# Patient Record
Sex: Female | Born: 1965 | Race: Asian | Hispanic: No | Marital: Married | Smoking: Never smoker
Health system: Southern US, Community
[De-identification: ages and names within clinical notes are randomized; demographics above are authoritative.]

## PROBLEM LIST (undated history)

## (undated) DIAGNOSIS — Z789 Other specified health status: Secondary | ICD-10-CM

## (undated) DIAGNOSIS — K219 Gastro-esophageal reflux disease without esophagitis: Secondary | ICD-10-CM

## (undated) DIAGNOSIS — I10 Essential (primary) hypertension: Secondary | ICD-10-CM

## (undated) HISTORY — PX: APPENDECTOMY: SHX54

## (undated) HISTORY — PX: NO PAST SURGERIES: SHX2092

---

## 2006-07-07 ENCOUNTER — Emergency Department (HOSPITAL_COMMUNITY): Admission: EM | Admit: 2006-07-07 | Discharge: 2006-07-07 | Payer: Self-pay | Admitting: Emergency Medicine

## 2006-08-11 ENCOUNTER — Ambulatory Visit (HOSPITAL_COMMUNITY): Admission: RE | Admit: 2006-08-11 | Discharge: 2006-08-11 | Payer: Self-pay | Admitting: Family Medicine

## 2006-10-10 ENCOUNTER — Encounter: Admission: RE | Admit: 2006-10-10 | Discharge: 2006-10-10 | Payer: Self-pay | Admitting: Obstetrics & Gynecology

## 2006-10-10 ENCOUNTER — Ambulatory Visit: Payer: Self-pay | Admitting: Obstetrics & Gynecology

## 2006-10-17 ENCOUNTER — Ambulatory Visit: Payer: Self-pay | Admitting: Obstetrics & Gynecology

## 2006-11-14 ENCOUNTER — Ambulatory Visit: Payer: Self-pay | Admitting: Obstetrics & Gynecology

## 2006-11-14 ENCOUNTER — Encounter: Admission: RE | Admit: 2006-11-14 | Discharge: 2006-11-14 | Payer: Self-pay | Admitting: Obstetrics & Gynecology

## 2006-11-21 ENCOUNTER — Ambulatory Visit (HOSPITAL_COMMUNITY): Admission: RE | Admit: 2006-11-21 | Discharge: 2006-11-21 | Payer: Self-pay | Admitting: Family Medicine

## 2006-11-21 ENCOUNTER — Ambulatory Visit: Payer: Self-pay | Admitting: Obstetrics & Gynecology

## 2006-11-24 ENCOUNTER — Ambulatory Visit: Payer: Self-pay | Admitting: Family Medicine

## 2006-11-28 ENCOUNTER — Ambulatory Visit: Payer: Self-pay | Admitting: Family Medicine

## 2006-12-01 ENCOUNTER — Ambulatory Visit: Payer: Self-pay | Admitting: Family Medicine

## 2006-12-05 ENCOUNTER — Ambulatory Visit (HOSPITAL_COMMUNITY): Admission: RE | Admit: 2006-12-05 | Discharge: 2006-12-05 | Payer: Self-pay | Admitting: Obstetrics & Gynecology

## 2006-12-05 ENCOUNTER — Ambulatory Visit: Payer: Self-pay | Admitting: Obstetrics & Gynecology

## 2006-12-08 ENCOUNTER — Ambulatory Visit: Payer: Self-pay | Admitting: Family Medicine

## 2006-12-12 ENCOUNTER — Ambulatory Visit (HOSPITAL_COMMUNITY): Admission: RE | Admit: 2006-12-12 | Discharge: 2006-12-12 | Payer: Self-pay | Admitting: Obstetrics & Gynecology

## 2006-12-12 ENCOUNTER — Ambulatory Visit: Payer: Self-pay | Admitting: Obstetrics & Gynecology

## 2006-12-15 ENCOUNTER — Ambulatory Visit: Payer: Self-pay | Admitting: Family Medicine

## 2006-12-19 ENCOUNTER — Ambulatory Visit (HOSPITAL_COMMUNITY): Admission: RE | Admit: 2006-12-19 | Discharge: 2006-12-19 | Payer: Self-pay | Admitting: Obstetrics & Gynecology

## 2006-12-19 ENCOUNTER — Ambulatory Visit: Payer: Self-pay | Admitting: Obstetrics & Gynecology

## 2006-12-21 ENCOUNTER — Inpatient Hospital Stay (HOSPITAL_COMMUNITY): Admission: RE | Admit: 2006-12-21 | Discharge: 2006-12-22 | Payer: Self-pay | Admitting: Family Medicine

## 2006-12-21 ENCOUNTER — Encounter: Payer: Self-pay | Admitting: Family Medicine

## 2006-12-21 ENCOUNTER — Ambulatory Visit: Payer: Self-pay | Admitting: Obstetrics & Gynecology

## 2007-07-27 IMAGING — US US OB FOLLOW-UP
1 series · 3 of 3 positions shown · non-contrast
Comparison: none

OBSTETRICAL ULTRASOUND:

 This ultrasound exam was performed in the [HOSPITAL] Ultrasound Department.  The OB US report was generated in the AS system, and faxed to the ordering physician.  This report is also available in [REDACTED] PACS.

[Series 1: us ob follow-up · 3 of 3 slices shown]
[im 1/3]
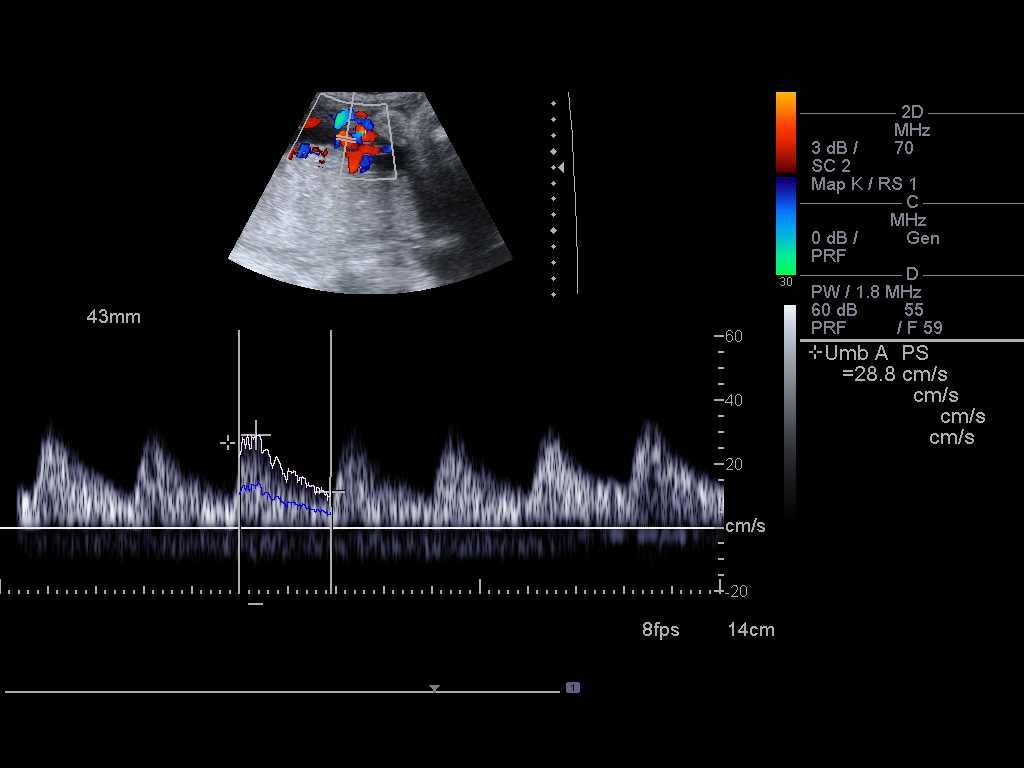
[im 2/3]
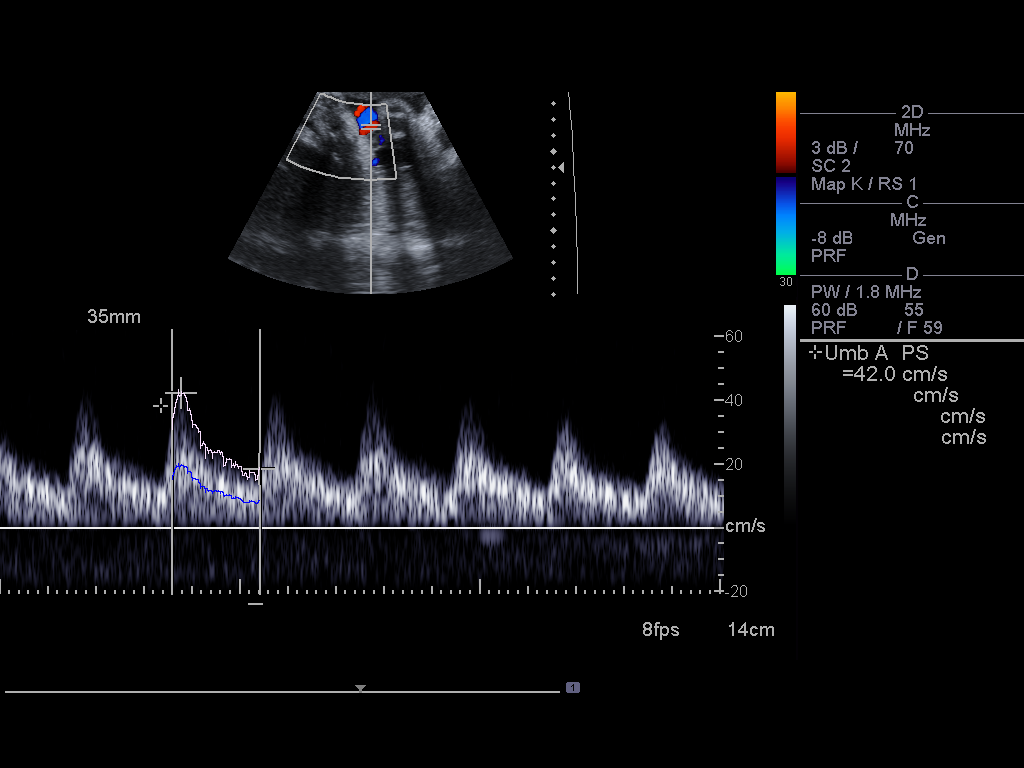
[im 3/3]
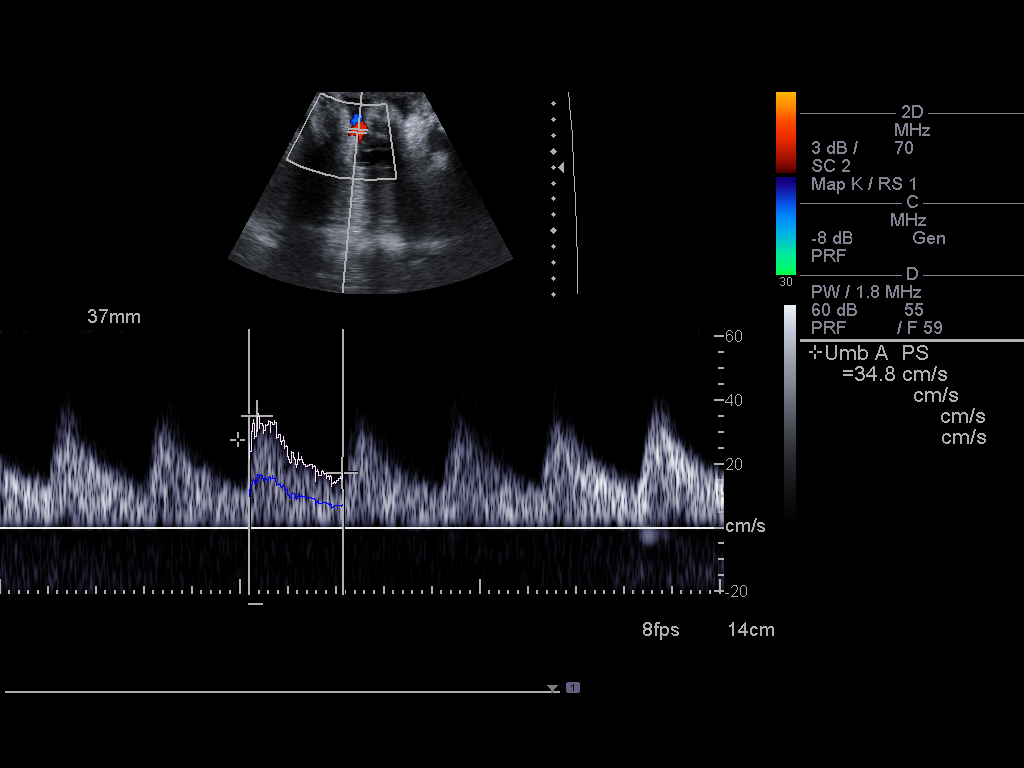

[3 of 3 positions shown; findings below may reference images not displayed]

IMPRESSION: See AS Obstetric US report.

## 2010-05-24 ENCOUNTER — Encounter: Payer: Self-pay | Admitting: *Deleted

## 2011-02-12 LAB — CBC
HCT: 29.8 — ABNORMAL LOW
HCT: 33.7 — ABNORMAL LOW
Hemoglobin: 10.9 — ABNORMAL LOW
Hemoglobin: 9.7 — ABNORMAL LOW
MCHC: 32.4
MCV: 67 — ABNORMAL LOW
RBC: 4.44
RBC: 5.02
RDW: 15.6 — ABNORMAL HIGH
WBC: 21.7 — ABNORMAL HIGH

## 2011-02-12 LAB — POCT URINALYSIS DIP (DEVICE)
Bilirubin Urine: NEGATIVE
Glucose, UA: NEGATIVE
Ketones, ur: NEGATIVE
Operator id: 297281
Protein, ur: 30 — AB
Specific Gravity, Urine: 1.02

## 2011-02-15 LAB — POCT URINALYSIS DIP (DEVICE)
Bilirubin Urine: NEGATIVE
Bilirubin Urine: NEGATIVE
Bilirubin Urine: NEGATIVE
Bilirubin Urine: NEGATIVE
Glucose, UA: NEGATIVE
Glucose, UA: NEGATIVE
Glucose, UA: NEGATIVE
Glucose, UA: NEGATIVE
Glucose, UA: NEGATIVE
Hgb urine dipstick: NEGATIVE
Hgb urine dipstick: NEGATIVE
Hgb urine dipstick: NEGATIVE
Hgb urine dipstick: NEGATIVE
Hgb urine dipstick: NEGATIVE
Ketones, ur: NEGATIVE
Nitrite: NEGATIVE
Nitrite: POSITIVE — AB
Specific Gravity, Urine: 1.02
Specific Gravity, Urine: 1.025
Specific Gravity, Urine: <=1.005
Specific Gravity, Urine: 1.015
Specific Gravity, Urine: 1.02
Urobilinogen, UA: 1
Urobilinogen, UA: 1
pH: 6.5
pH: 6.5
pH: 7
pH: 7
pH: 7

## 2011-02-16 LAB — POCT URINALYSIS DIP (DEVICE)
Bilirubin Urine: NEGATIVE
Glucose, UA: NEGATIVE
Hgb urine dipstick: NEGATIVE
Ketones, ur: NEGATIVE
Nitrite: NEGATIVE
Specific Gravity, Urine: 1.02
pH: 7

## 2011-02-17 LAB — POCT URINALYSIS DIP (DEVICE)
Bilirubin Urine: NEGATIVE
Glucose, UA: NEGATIVE
Hgb urine dipstick: NEGATIVE
Ketones, ur: NEGATIVE
Nitrite: NEGATIVE

## 2011-02-18 LAB — POCT URINALYSIS DIP (DEVICE)
Glucose, UA: NEGATIVE
Hgb urine dipstick: NEGATIVE
Ketones, ur: NEGATIVE
Nitrite: NEGATIVE

## 2013-05-09 ENCOUNTER — Observation Stay (HOSPITAL_COMMUNITY)
Admission: EM | Admit: 2013-05-09 | Discharge: 2013-05-10 | Disposition: A | Discharge status: Home / Self Care | Payer: Medicaid Other | Attending: General Surgery | Admitting: General Surgery

## 2013-05-09 ENCOUNTER — Encounter (HOSPITAL_COMMUNITY): Payer: Self-pay | Admitting: Emergency Medicine

## 2013-05-09 ENCOUNTER — Encounter (HOSPITAL_COMMUNITY)
Admission: EM | Disposition: A | Discharge status: Home / Self Care | Payer: Self-pay | Source: Home / Self Care | Attending: Emergency Medicine

## 2013-05-09 ENCOUNTER — Ambulatory Visit (HOSPITAL_COMMUNITY): Payer: Medicaid Other

## 2013-05-09 ENCOUNTER — Encounter (HOSPITAL_COMMUNITY): Payer: Medicaid Other | Admitting: Anesthesiology

## 2013-05-09 ENCOUNTER — Observation Stay (HOSPITAL_COMMUNITY): Payer: Medicaid Other | Admitting: Anesthesiology

## 2013-05-09 DIAGNOSIS — J90 Pleural effusion, not elsewhere classified: Secondary | ICD-10-CM | POA: Insufficient documentation

## 2013-05-09 DIAGNOSIS — K3580 Unspecified acute appendicitis: Secondary | ICD-10-CM

## 2013-05-09 DIAGNOSIS — K37 Unspecified appendicitis: Secondary | ICD-10-CM | POA: Diagnosis present

## 2013-05-09 DIAGNOSIS — K429 Umbilical hernia without obstruction or gangrene: Secondary | ICD-10-CM | POA: Insufficient documentation

## 2013-05-09 HISTORY — PX: LAPAROSCOPIC APPENDECTOMY: SHX408

## 2013-05-09 HISTORY — DX: Other specified health status: Z78.9

## 2013-05-09 LAB — COMPREHENSIVE METABOLIC PANEL
ALT: 11 U/L (ref 0–35)
AST: 16 U/L (ref 0–37)
Albumin: 4.5 g/dL (ref 3.5–5.2)
Alkaline Phosphatase: 63 U/L (ref 39–117)
BUN: 16 mg/dL (ref 6–23)
CO2: 25 mEq/L (ref 19–32)
Calcium: 9.5 mg/dL (ref 8.4–10.5)
Chloride: 98 mEq/L (ref 96–112)
Creatinine, Ser: 0.63 mg/dL (ref 0.50–1.10)
GFR calc Af Amer: >90 mL/min (ref >90)
GFR calc non Af Amer: >90 mL/min (ref >90)
Glucose, Bld: 97 mg/dL (ref 70–99)
Potassium: 3.8 mEq/L (ref 3.7–5.3)
Sodium: 138 mEq/L (ref 137–147)
Total Bilirubin: 0.4 mg/dL (ref 0.3–1.2)
Total Protein: 7.8 g/dL (ref 6.0–8.3)

## 2013-05-09 LAB — CBC WITH DIFFERENTIAL/PLATELET
Basophils Absolute: 0 10*3/uL (ref 0.0–0.1)
Basophils Relative: 0 % (ref 0–1)
Eosinophils Absolute: 0.1 10*3/uL (ref 0.0–0.7)
Eosinophils Relative: 1 % (ref 0–5)
HCT: 39 % (ref 36.0–46.0)
Hemoglobin: 12.5 g/dL (ref 12.0–15.0)
Lymphocytes Relative: 15 % (ref 12–46)
Lymphs Abs: 2.3 10*3/uL (ref 0.7–4.0)
MCH: 20 pg — ABNORMAL LOW (ref 26.0–34.0)
MCHC: 32.1 g/dL (ref 30.0–36.0)
MCV: 62.3 fL — ABNORMAL LOW (ref 78.0–100.0)
Monocytes Absolute: 0.7 10*3/uL (ref 0.1–1.0)
Monocytes Relative: 4 % (ref 3–12)
Neutro Abs: 12.1 10*3/uL — ABNORMAL HIGH (ref 1.7–7.7)
Neutrophils Relative %: 80 % — ABNORMAL HIGH (ref 43–77)
Platelets: 204 10*3/uL (ref 150–400)
RBC: 6.26 MIL/uL — ABNORMAL HIGH (ref 3.87–5.11)
RDW: 14.4 % (ref 11.5–15.5)
WBC: 15.1 10*3/uL — ABNORMAL HIGH (ref 4.0–10.5)

## 2013-05-09 LAB — URINALYSIS, ROUTINE W REFLEX MICROSCOPIC
Bilirubin Urine: NEGATIVE
Glucose, UA: NEGATIVE mg/dL
Ketones, ur: 15 mg/dL — AB
Leukocytes, UA: NEGATIVE
Nitrite: NEGATIVE
Protein, ur: NEGATIVE mg/dL
Specific Gravity, Urine: 1.025 (ref 1.005–1.030)
Urobilinogen, UA: 0.2 mg/dL (ref 0.0–1.0)
pH: 6 (ref 5.0–8.0)

## 2013-05-09 LAB — LIPASE, BLOOD: Lipase: 23 U/L (ref 11–59)

## 2013-05-09 LAB — URINE MICROSCOPIC-ADD ON

## 2013-05-09 LAB — MRSA PCR SCREENING: MRSA by PCR: NEGATIVE

## 2013-05-09 LAB — POCT PREGNANCY, URINE: Preg Test, Ur: NEGATIVE

## 2013-05-09 SURGERY — APPENDECTOMY, LAPAROSCOPIC
Anesthesia: General | Site: Abdomen

## 2013-05-09 MED ORDER — MIDAZOLAM HCL 2 MG/2ML IJ SOLN
INTRAMUSCULAR | Status: AC
  Filled 2013-05-09: qty 2

## 2013-05-09 MED ORDER — FENTANYL CITRATE 0.05 MG/ML IJ SOLN
INTRAMUSCULAR | Status: DC | PRN
  Administered 2013-05-09: 50 ug via INTRAVENOUS
  Administered 2013-05-09 (×2): 25 ug via INTRAVENOUS
  Administered 2013-05-09 (×2): 50 ug via INTRAVENOUS

## 2013-05-09 MED ORDER — LACTATED RINGERS IR SOLN
Status: DC | PRN
  Administered 2013-05-09: 1000 mL

## 2013-05-09 MED ORDER — GLYCOPYRROLATE 0.2 MG/ML IJ SOLN
INTRAMUSCULAR | Status: AC
  Filled 2013-05-09: qty 3

## 2013-05-09 MED ORDER — ONDANSETRON HCL 4 MG/2ML IJ SOLN
4.0000 mg | Freq: Once | INTRAMUSCULAR | Status: AC
Start: 2013-05-09 — End: 2013-05-09
  Administered 2013-05-09: 4 mg via INTRAVENOUS
  Filled 2013-05-09: qty 2

## 2013-05-09 MED ORDER — DEXAMETHASONE SODIUM PHOSPHATE 10 MG/ML IJ SOLN
INTRAMUSCULAR | Status: DC | PRN
  Administered 2013-05-09: 5 mg via INTRAVENOUS

## 2013-05-09 MED ORDER — ONDANSETRON HCL 4 MG/2ML IJ SOLN
INTRAMUSCULAR | Status: DC | PRN
  Administered 2013-05-09: 4 mg via INTRAVENOUS

## 2013-05-09 MED ORDER — HYDROMORPHONE HCL PF 1 MG/ML IJ SOLN
1.0000 mg | Freq: Once | INTRAMUSCULAR | Status: AC
  Administered 2013-05-09: 1 mg via INTRAVENOUS
  Filled 2013-05-09: qty 1

## 2013-05-09 MED ORDER — NEOSTIGMINE METHYLSULFATE 1 MG/ML IJ SOLN
INTRAMUSCULAR | Status: DC | PRN
  Administered 2013-05-09: 3 mg via INTRAVENOUS

## 2013-05-09 MED ORDER — PROMETHAZINE HCL 25 MG/ML IJ SOLN: 6.2500 mg | INTRAMUSCULAR | Status: DC | PRN

## 2013-05-09 MED ORDER — EPHEDRINE SULFATE 50 MG/ML IJ SOLN
INTRAMUSCULAR | Status: AC
  Filled 2013-05-09: qty 1

## 2013-05-09 MED ORDER — FENTANYL CITRATE 0.05 MG/ML IJ SOLN
INTRAMUSCULAR | Status: AC
  Filled 2013-05-09: qty 5

## 2013-05-09 MED ORDER — PANTOPRAZOLE SODIUM 40 MG IV SOLR
40.0000 mg | Freq: Every day | INTRAVENOUS | Status: DC
  Administered 2013-05-09 – 2013-05-10 (×2): 40 mg via INTRAVENOUS
  Filled 2013-05-09 (×2): qty 40

## 2013-05-09 MED ORDER — DEXAMETHASONE SODIUM PHOSPHATE 10 MG/ML IJ SOLN
INTRAMUSCULAR | Status: AC
  Filled 2013-05-09: qty 1

## 2013-05-09 MED ORDER — SODIUM CHLORIDE 0.9 % IV SOLN
1.0000 g | INTRAVENOUS | Status: DC
  Administered 2013-05-09: 1 g via INTRAVENOUS
  Filled 2013-05-09: qty 1

## 2013-05-09 MED ORDER — SODIUM CHLORIDE 0.9 % IJ SOLN
INTRAMUSCULAR | Status: AC
  Filled 2013-05-09: qty 10

## 2013-05-09 MED ORDER — GLYCOPYRROLATE 0.2 MG/ML IJ SOLN
INTRAMUSCULAR | Status: DC | PRN
  Administered 2013-05-09: 0.2 mg via INTRAVENOUS
  Administered 2013-05-09: 0.4 mg via INTRAVENOUS

## 2013-05-09 MED ORDER — NEOSTIGMINE METHYLSULFATE 1 MG/ML IJ SOLN
INTRAMUSCULAR | Status: AC
  Filled 2013-05-09: qty 10

## 2013-05-09 MED ORDER — KETOROLAC TROMETHAMINE 30 MG/ML IJ SOLN
15.0000 mg | Freq: Once | INTRAMUSCULAR | Status: AC | PRN
  Administered 2013-05-09: 30 mg via INTRAVENOUS

## 2013-05-09 MED ORDER — MIDAZOLAM HCL 5 MG/5ML IJ SOLN
INTRAMUSCULAR | Status: DC | PRN
  Administered 2013-05-09: 1 mg via INTRAVENOUS

## 2013-05-09 MED ORDER — PROPOFOL 10 MG/ML IV BOLUS
INTRAVENOUS | Status: AC
  Filled 2013-05-09: qty 20

## 2013-05-09 MED ORDER — BUPIVACAINE-EPINEPHRINE 0.25% -1:200000 IJ SOLN
INTRAMUSCULAR | Status: AC
  Filled 2013-05-09: qty 1

## 2013-05-09 MED ORDER — KCL IN DEXTROSE-NACL 20-5-0.9 MEQ/L-%-% IV SOLN
INTRAVENOUS | Status: DC
  Administered 2013-05-10: via INTRAVENOUS
  Filled 2013-05-09 (×2): qty 1000

## 2013-05-09 MED ORDER — ONDANSETRON HCL 4 MG/2ML IJ SOLN: 4.0000 mg | Freq: Four times a day (QID) | INTRAMUSCULAR | Status: DC | PRN

## 2013-05-09 MED ORDER — IOHEXOL 300 MG/ML  SOLN
100.0000 mL | Freq: Once | INTRAMUSCULAR | Status: AC | PRN
  Administered 2013-05-09: 100 mL via INTRAVENOUS

## 2013-05-09 MED ORDER — ROCURONIUM BROMIDE 100 MG/10ML IV SOLN
INTRAVENOUS | Status: AC
  Filled 2013-05-09: qty 1

## 2013-05-09 MED ORDER — LIDOCAINE HCL (CARDIAC) 20 MG/ML IV SOLN
INTRAVENOUS | Status: DC | PRN
  Administered 2013-05-09: 50 mg via INTRAVENOUS

## 2013-05-09 MED ORDER — MORPHINE SULFATE 2 MG/ML IJ SOLN: 2.0000 mg | INTRAMUSCULAR | Status: DC | PRN

## 2013-05-09 MED ORDER — ROCURONIUM BROMIDE 100 MG/10ML IV SOLN
INTRAVENOUS | Status: DC | PRN
  Administered 2013-05-09: 20 mg via INTRAVENOUS

## 2013-05-09 MED ORDER — 0.9 % SODIUM CHLORIDE (POUR BTL) OPTIME
TOPICAL | Status: DC | PRN
  Administered 2013-05-09: 1000 mL

## 2013-05-09 MED ORDER — PROPOFOL 10 MG/ML IV BOLUS
INTRAVENOUS | Status: DC | PRN
  Administered 2013-05-09: 120 mg via INTRAVENOUS

## 2013-05-09 MED ORDER — OXYCODONE-ACETAMINOPHEN 5-325 MG PO TABS
1.0000 | ORAL_TABLET | Freq: Four times a day (QID) | ORAL | Status: DC | PRN
  Administered 2013-05-10: 1 via ORAL
  Filled 2013-05-09: qty 1

## 2013-05-09 MED ORDER — LIDOCAINE HCL (CARDIAC) 20 MG/ML IV SOLN
INTRAVENOUS | Status: AC
Start: 2013-05-09 — End: 2013-05-09
  Filled 2013-05-09: qty 5

## 2013-05-09 MED ORDER — LACTATED RINGERS IV SOLN
INTRAVENOUS | Status: DC
  Administered 2013-05-09: 1000 mL via INTRAVENOUS

## 2013-05-09 MED ORDER — ONDANSETRON HCL 4 MG/2ML IJ SOLN
INTRAMUSCULAR | Status: AC
  Filled 2013-05-09: qty 2

## 2013-05-09 MED ORDER — SODIUM CHLORIDE 0.9 % IV BOLUS (SEPSIS)
1000.0000 mL | Freq: Once | INTRAVENOUS | Status: AC
  Administered 2013-05-09: 1000 mL via INTRAVENOUS

## 2013-05-09 MED ORDER — MORPHINE SULFATE 2 MG/ML IJ SOLN
2.0000 mg | INTRAMUSCULAR | Status: DC | PRN
  Administered 2013-05-09: 2 mg via INTRAVENOUS
  Filled 2013-05-09: qty 1

## 2013-05-09 MED ORDER — KETOROLAC TROMETHAMINE 30 MG/ML IJ SOLN
INTRAMUSCULAR | Status: AC
  Administered 2013-05-09: 30 mg
  Filled 2013-05-09: qty 1

## 2013-05-09 MED ORDER — IOHEXOL 300 MG/ML  SOLN
50.0000 mL | Freq: Once | INTRAMUSCULAR | Status: AC | PRN
  Administered 2013-05-09: 50 mL via ORAL

## 2013-05-09 MED ORDER — KCL IN DEXTROSE-NACL 20-5-0.9 MEQ/L-%-% IV SOLN
INTRAVENOUS | Status: DC
  Administered 2013-05-09: 18:00:00 via INTRAVENOUS
  Filled 2013-05-09 (×2): qty 1000

## 2013-05-09 MED ORDER — HYDROMORPHONE HCL PF 1 MG/ML IJ SOLN: 0.2500 mg | INTRAMUSCULAR | Status: DC | PRN

## 2013-05-09 MED ORDER — LACTATED RINGERS IV SOLN
INTRAVENOUS | Status: DC | PRN
  Administered 2013-05-09: 21:00:00 via INTRAVENOUS

## 2013-05-09 MED ORDER — BUPIVACAINE-EPINEPHRINE 0.25% -1:200000 IJ SOLN
INTRAMUSCULAR | Status: DC | PRN
  Administered 2013-05-09: 20 mL

## 2013-05-09 SURGICAL SUPPLY — 43 items
APPLIER CLIP 5 13 M/L LIGAMAX5 (MISCELLANEOUS) ×3
APPLIER CLIP ROT 10 11.4 M/L (STAPLE)
CABLE HI FREQUENCY MONOPOLAR (ELECTROSURGICAL) ×3 IMPLANT
CABLE HIGH FREQUENCY MONO STRZ (ELECTRODE) ×3 IMPLANT
CANISTER SUCTION 2500CC (MISCELLANEOUS) ×3 IMPLANT
CHLORAPREP W/TINT 26ML (MISCELLANEOUS) ×3 IMPLANT
CLIP APPLIE 5 13 M/L LIGAMAX5 (MISCELLANEOUS) ×1 IMPLANT
CLIP APPLIE ROT 10 11.4 M/L (STAPLE) IMPLANT
CUTTER FLEX LINEAR 45M (STAPLE) ×3 IMPLANT
DECANTER SPIKE VIAL GLASS SM (MISCELLANEOUS) ×3 IMPLANT
DERMABOND ADVANCED (GAUZE/BANDAGES/DRESSINGS) ×2
DERMABOND ADVANCED .7 DNX12 (GAUZE/BANDAGES/DRESSINGS) ×1 IMPLANT
DRAPE LAPAROSCOPIC ABDOMINAL (DRAPES) ×3 IMPLANT
DRAPE UTILITY XL STRL (DRAPES) ×3 IMPLANT
ELECT REM PT RETURN 9FT ADLT (ELECTROSURGICAL) ×3
ELECTRODE REM PT RTRN 9FT ADLT (ELECTROSURGICAL) ×1 IMPLANT
GLOVE BIO SURGEON STRL SZ 6.5 (GLOVE) ×2 IMPLANT
GLOVE BIO SURGEONS STRL SZ 6.5 (GLOVE) ×1
GLOVE BIOGEL PI IND STRL 7.0 (GLOVE) ×2 IMPLANT
GLOVE BIOGEL PI IND STRL 7.5 (GLOVE) ×2 IMPLANT
GLOVE BIOGEL PI INDICATOR 7.0 (GLOVE) ×4
GLOVE BIOGEL PI INDICATOR 7.5 (GLOVE) ×4
GLOVE SURG SS PI 6.5 STRL IVOR (GLOVE) ×3 IMPLANT
GOWN STRL REIN 2XL XLG LVL4 (GOWN DISPOSABLE) ×3 IMPLANT
GOWN STRL REUS W/TWL XL LVL3 (GOWN DISPOSABLE) ×6 IMPLANT
KIT BASIN OR (CUSTOM PROCEDURE TRAY) ×3 IMPLANT
POUCH SPECIMEN RETRIEVAL 10MM (ENDOMECHANICALS) ×3 IMPLANT
RELOAD 45 VASCULAR/THIN (ENDOMECHANICALS) ×3 IMPLANT
RELOAD STAPLE TA45 3.5 REG BLU (ENDOMECHANICALS) ×3 IMPLANT
SCALPEL HARMONIC ACE (MISCELLANEOUS) IMPLANT
SCISSORS LAP 5X35 DISP (ENDOMECHANICALS) ×3 IMPLANT
SET IRRIG TUBING LAPAROSCOPIC (IRRIGATION / IRRIGATOR) ×3 IMPLANT
SOLUTION ANTI FOG 6CC (MISCELLANEOUS) ×3 IMPLANT
SUT VIC AB 4-0 PS2 27 (SUTURE) ×3 IMPLANT
SUT VICRYL 0 UR6 27IN ABS (SUTURE) ×3 IMPLANT
TOWEL OR 17X26 10 PK STRL BLUE (TOWEL DISPOSABLE) ×3 IMPLANT
TOWEL OR NON WOVEN STRL DISP B (DISPOSABLE) ×3 IMPLANT
TRAY FOLEY CATH 14FRSI W/METER (CATHETERS) ×3 IMPLANT
TRAY LAP CHOLE (CUSTOM PROCEDURE TRAY) ×3 IMPLANT
TROCAR BLADELESS OPT 5 75 (ENDOMECHANICALS) ×6 IMPLANT
TROCAR XCEL 12X100 BLDLESS (ENDOMECHANICALS) IMPLANT
TROCAR XCEL BLUNT TIP 100MML (ENDOMECHANICALS) ×3 IMPLANT
TUBING INSUFFLATION 10FT LAP (TUBING) ×3 IMPLANT

## 2013-05-09 NOTE — H&P (Signed)
Kimberly Blake is an 48 y.o. female.   Chief Complaint: abdominal pain HPI: The pt is a 48yo female [place]. She comes to the ER today with a 1 month history of RLQ pain. The family is not sure if she has had any nausea or vomiting. She denies fever. A CT scan was done that showed acute appendicitis but no evidence of perforation  History reviewed. No pertinent past medical history.  History reviewed. No pertinent past surgical history.  History reviewed. No pertinent family history. Social History:  reports that she has never smoked. She does not have any smokeless tobacco history on file. She reports that she does not drink alcohol or use illicit drugs.  Allergies: No Known Allergies   (Not in a hospital admission)  Results for orders placed during the hospital encounter of 05/09/13 (from the past 48 hour(s))  CBC WITH DIFFERENTIAL     Status: Abnormal   Collection Time    05/09/13 12:55 PM      Result Value Range   WBC 15.1 (*) 4.0 - 10.5 K/uL   RBC 6.26 (*) 3.87 - 5.11 MIL/uL   Hemoglobin 12.5  12.0 - 15.0 g/dL   HCT 39.0  36.0 - 46.0 %   MCV 62.3 (*) 78.0 - 100.0 fL   MCH 20.0 (*) 26.0 - 34.0 pg   MCHC 32.1  30.0 - 36.0 g/dL   RDW 14.4  11.5 - 15.5 %   Platelets 204  150 - 400 K/uL   Comment: SPECIMEN CHECKED FOR CLOTS     REPEATED TO VERIFY   Neutrophils Relative % 80 (*) 43 - 77 %   Neutro Abs 12.1 (*) 1.7 - 7.7 K/uL   Lymphocytes Relative 15  12 - 46 %   Lymphs Abs 2.3  0.7 - 4.0 K/uL   Monocytes Relative 4  3 - 12 %   Monocytes Absolute 0.7  0.1 - 1.0 K/uL   Eosinophils Relative 1  0 - 5 %   Eosinophils Absolute 0.1  0.0 - 0.7 K/uL   Basophils Relative 0  0 - 1 %   Basophils Absolute 0.0  0.0 - 0.1 K/uL  COMPREHENSIVE METABOLIC PANEL     Status: None   Collection Time    05/09/13 12:55 PM      Result Value Range   Sodium 138  137 - 147 mEq/L   Potassium 3.8  3.7 - 5.3 mEq/L   Chloride 98  96 - 112 mEq/L   CO2 25  19 - 32 mEq/L   Glucose, Bld 97  70 - 99  mg/dL   BUN 16  6 - 23 mg/dL   Creatinine, Ser 0.63  0.50 - 1.10 mg/dL   Calcium 9.5  8.4 - 10.5 mg/dL   Total Protein 7.8  6.0 - 8.3 g/dL   Albumin 4.5  3.5 - 5.2 g/dL   AST 16  0 - 37 U/L   ALT 11  0 - 35 U/L   Alkaline Phosphatase 63  39 - 117 U/L   Total Bilirubin 0.4  0.3 - 1.2 mg/dL   GFR calc non Af Amer >90  >90 mL/min   GFR calc Af Amer >90  >90 mL/min   Comment: (NOTE)     The eGFR has been calculated using the CKD EPI equation.     This calculation has not been validated in all clinical situations.     eGFR's persistently <90 mL/min signify possible Chronic Kidney  Disease.  LIPASE, BLOOD     Status: None   Collection Time    05/09/13 12:55 PM      Result Value Range   Lipase 23  11 - 59 U/L  URINALYSIS, ROUTINE W REFLEX MICROSCOPIC     Status: Abnormal   Collection Time    05/09/13  1:45 PM      Result Value Range   Color, Urine YELLOW  YELLOW   APPearance CLEAR  CLEAR   Specific Gravity, Urine 1.025  1.005 - 1.030   pH 6.0  5.0 - 8.0   Glucose, UA NEGATIVE  NEGATIVE mg/dL   Hgb urine dipstick SMALL (*) NEGATIVE   Bilirubin Urine NEGATIVE  NEGATIVE   Ketones, ur 15 (*) NEGATIVE mg/dL   Protein, ur NEGATIVE  NEGATIVE mg/dL   Urobilinogen, UA 0.2  0.0 - 1.0 mg/dL   Nitrite NEGATIVE  NEGATIVE   Leukocytes, UA NEGATIVE  NEGATIVE  URINE MICROSCOPIC-ADD ON     Status: Abnormal   Collection Time    05/09/13  1:45 PM      Result Value Range   Squamous Epithelial / LPF FEW (*) RARE   WBC, UA 0-2  <3 WBC/hpf   RBC / HPF 0-2  <3 RBC/hpf   Urine-Other MUCOUS PRESENT    POCT PREGNANCY, URINE     Status: None   Collection Time    05/09/13  1:58 PM      Result Value Range   Preg Test, Ur NEGATIVE  NEGATIVE   Comment:            THE SENSITIVITY OF THIS     METHODOLOGY IS >24 mIU/mL   Ct Abdomen Pelvis W Contrast  05/09/2013   CLINICAL DATA:  Right lower quadrant pain.  EXAM: CT ABDOMEN AND PELVIS WITH CONTRAST  TECHNIQUE: Multidetector CT imaging of the abdomen  and pelvis was performed using the standard protocol following bolus administration of intravenous contrast.  CONTRAST:  146mL OMNIPAQUE IOHEXOL 300 MG/ML  SOLN  COMPARISON:  None.  FINDINGS: Lung Bases: Dependent atelectasis at the lung bases with a trace left pleural effusion and trace right pleural effusion.  Liver:  Normal.  Spleen:  Normal.  Gallbladder:  Normal.  Common bile duct:  Normal.  Pancreas:  Normal.  Adrenal glands:  Normal bilaterally.  Kidneys: Normal enhancement and delayed excretion of contrast. Both ureters appear within normal limits.  Stomach:  Normal.  Small bowel:  Normal.  Colon: Borderline size of the appendix. There is mild periappendiceal fat stranding with appearance suggesting early acute appendicitis. Diameter the appendix is 8 mm. The appendix is retrocecal in position. Ileocecal valve appears normal. Moderate stool burden.  Pelvic Genitourinary: Physiologic appearance of the uterus and adnexa. Urinary bladder appears normal. No free fluid.  Bones:  No aggressive osseous lesions.  Vasculature: Normal.  Body Wall: Injection granulomata are present in the gluteal regions bilaterally. Tiny fat containing periumbilical hernia.  IMPRESSION: Borderline size of the appendix with mild periappendiceal fat stranding suspicious for early acute appendicitis. No perforation or abscess identified.   Electronically Signed   By: Dereck Ligas M.D.   On: 05/09/2013 15:37    Review of Systems  Constitutional: Negative.   HENT: Negative.   Eyes: Negative.   Respiratory: Negative.   Cardiovascular: Negative.   Gastrointestinal: Positive for abdominal pain.  Genitourinary: Negative.   Musculoskeletal: Negative.   Skin: Negative.   Neurological: Negative.   Endo/Heme/Allergies: Negative.   Psychiatric/Behavioral: Negative.  Blood pressure 132/89, pulse 86, temperature 97.8 F (36.6 C), temperature source Oral, resp. rate 18, last menstrual period 05/02/2013, SpO2 100.00%. Physical  Exam  Constitutional: She is oriented to person, place, and time. She appears well-developed and well-nourished.  HENT:  Head: Normocephalic and atraumatic.  Eyes: Conjunctivae and EOM are normal. Pupils are equal, round, and reactive to light.  Neck: Normal range of motion. Neck supple.  Cardiovascular: Normal rate, regular rhythm and normal heart sounds.   Respiratory: Effort normal and breath sounds normal.  GI: Soft. Bowel sounds are normal.  She is focally tender in RLQ. No peritonitis  Musculoskeletal: Normal range of motion.  Neurological: She is alert and oriented to person, place, and time.  Skin: Skin is warm and dry.  Psychiatric: She has a normal mood and affect. Her behavior is normal.     Assessment/Plan The pt appears to have acute appendicitis. Because of the risk of perforation and sepsis I think she would benefit from having her appendix removed. I have discussed with her the risks and benefits of surgery as well as some of the technical aspects and she understands and wishes to proceed. Plan for laparoscopic appendectomy tonight  TOTH III,Kharlie Bring S 05/09/2013, 4:43 PM

## 2013-05-09 NOTE — Progress Notes (Signed)
Reviewed pt's labs and CT.  Exam correlates with acute appendicitis.  Will proceed to OR for laparoscopic appendectomy.

## 2013-05-09 NOTE — ED Notes (Addendum)
Pt c/o RLQ pain x 1 month.  Denies NVD.  Went to health department today and was sent here.  Pt speaks "rade (rah-day)".  Daughter is translating.

## 2013-05-09 NOTE — Preoperative (Signed)
Beta Blockers   Reason not to administer Beta Blockers:Not Applicable 

## 2013-05-09 NOTE — ED Notes (Signed)
Per Daughter, pt with abdominal pain x 1 month.  No primary MD.  Pt daughter interpretor.  Pt does not speak [place].  Small child with pt and daughter.  No other child care available.

## 2013-05-09 NOTE — Anesthesia Preprocedure Evaluation (Signed)
Anesthesia Evaluation  Patient identified by MRN, date of birth, ID band Patient awake    Reviewed: Allergy & Precautions, H&P , NPO status , Patient's Chart, lab work & pertinent test results  Airway Mallampati: II  TM Distance: >3 FB Neck ROM: Full    Dental no notable dental hx.    Pulmonary neg pulmonary ROS,  breath sounds clear to auscultation  Pulmonary exam normal       Cardiovascular negative cardio ROS  Rhythm:Regular Rate:Normal     Neuro/Psych negative neurological ROS  negative psych ROS   GI/Hepatic negative GI ROS, Neg liver ROS,   Endo/Other  negative endocrine ROS  Renal/GU negative Renal ROS  negative genitourinary   Musculoskeletal negative musculoskeletal ROS (+)   Abdominal   Peds negative pediatric ROS (+)  Hematology negative hematology ROS (+)   Anesthesia Other Findings   Reproductive/Obstetrics negative OB ROS                             Anesthesia Physical Anesthesia Plan  ASA: I  Anesthesia Plan: General   Post-op Pain Management:    Induction: Intravenous and Rapid sequence  Airway Management Planned: Oral ETT  Additional Equipment:   Intra-op Plan:   Post-operative Plan: Extubation in OR  Informed Consent: I have reviewed the patients History and Physical, chart, labs and discussed the procedure including the risks, benefits and alternatives for the proposed anesthesia with the patient or authorized representative who has indicated his/her understanding and acceptance.   Dental advisory given  Plan Discussed with: CRNA and Surgeon  Anesthesia Plan Comments:         Anesthesia Quick Evaluation  

## 2013-05-09 NOTE — Transfer of Care (Signed)
Immediate Anesthesia Transfer of Care Note  Patient: Kimberly Blake  Procedure(s) Performed: Procedure(s) (LRB): APPENDECTOMY LAPAROSCOPIC (N/A)  Patient Location: PACU  Anesthesia Type: General  Level of Consciousness: sedated, patient cooperative and responds to stimulation  Airway & Oxygen Therapy: Patient Spontanous Breathing and Patient connected to face mask oxgen  Post-op Assessment: Report given to PACU RN and Post -op Vital signs reviewed and stable  Post vital signs: Reviewed and stable  Complications: No apparent anesthesia complications

## 2013-05-09 NOTE — ED Provider Notes (Signed)
CSN: [id number]     Arrival date & time 05/09/13  1235 History   First MD Initiated Contact with Patient 05/09/13 1305     Chief Complaint  Patient presents with  . Abdominal Pain   (Consider location/radiation/quality/duration/timing/severity/associated sxs/prior Treatment) HPI  48 year old female with right lower quadrant pain. Hx through daughter translating. Pt is [place]. Pt onset about a month ago. Pain has being getting progressively worse and much more severe in the past two days. Pain is constant and does not radiate. Worse with movement. Anorexia, otherwise no associated symptoms. Denies or vomiting. No diarrhea. No urinary complaints. No unusual vaginal bleeding or discharge. No history of similar pain. No fevers or chills.   History reviewed. No pertinent past medical history. History reviewed. No pertinent past surgical history. History reviewed. No pertinent family history. History  Substance Use Topics  . Smoking status: Never Smoker   . Smokeless tobacco: Not on file  . Alcohol Use: No   OB History   Grav Para Term Preterm Abortions TAB SAB Ect Mult Living                 Review of Systems  All systems reviewed and negative, other than as noted in HPI.   Allergies  Review of patient's allergies indicates no known allergies.  Home Medications  No current outpatient prescriptions on file. BP 132/89  Pulse 86  Temp(Src) 97.8 F (36.6 C) (Oral)  Resp 18  SpO2 100%  LMP 05/02/2013 Physical Exam  Nursing note and vitals reviewed. Constitutional: She appears well-developed and well-nourished. No distress.  HENT:  Head: Normocephalic and atraumatic.  Eyes: Conjunctivae are normal. Right eye exhibits no discharge. Left eye exhibits no discharge.  Neck: Neck supple.  Cardiovascular: Normal rate, regular rhythm and normal heart sounds.  Exam reveals no gallop and no friction rub.   No murmur heard. Pulmonary/Chest: Effort normal and breath sounds normal. No  respiratory distress.  Abdominal: Soft. She exhibits no distension. There is tenderness. There is guarding. There is no rebound.  Right lower quadrant tenderness with voluntary guarding. No rebound. No distention. There is scars noted.  Musculoskeletal: She exhibits no edema and no tenderness.  Neurological: She is alert.  Skin: Skin is warm and dry.  Psychiatric: She has a normal mood and affect. Her behavior is normal. Thought content normal.    ED Course  Procedures (including critical care time) Labs Review Labs Reviewed  CBC WITH DIFFERENTIAL - Abnormal; Notable for the following:    WBC 15.1 (*)    RBC 6.26 (*)    MCV 62.3 (*)    MCH 20.0 (*)    Neutrophils Relative % 80 (*)    Neutro Abs 12.1 (*)    All other components within normal limits  URINALYSIS, ROUTINE W REFLEX MICROSCOPIC - Abnormal; Notable for the following:    Hgb urine dipstick SMALL (*)    Ketones, ur 15 (*)    All other components within normal limits  URINE MICROSCOPIC-ADD ON - Abnormal; Notable for the following:    Squamous Epithelial / LPF FEW (*)    All other components within normal limits  COMPREHENSIVE METABOLIC PANEL  LIPASE, BLOOD  POCT PREGNANCY, URINE   Imaging Review Ct Abdomen Pelvis W Contrast  05/09/2013   CLINICAL DATA:  Right lower quadrant pain.  EXAM: CT ABDOMEN AND PELVIS WITH CONTRAST  TECHNIQUE: Multidetector CT imaging of the abdomen and pelvis was performed using the standard protocol following bolus administration of intravenous contrast.  CONTRAST:  OMNIPAQUE IOHEXOL 300 MG/ML  SOLN  COMPARISON:  None.  FINDINGS: Lung Bases: Dependent atelectasis at the lung bases with a trace left pleural effusion and trace right pleural effusion.  Liver:  Normal.  Spleen:  Normal.  Gallbladder:  Normal.  Common bile duct:  Normal.  Pancreas:  Normal.  Adrenal glands:  Normal bilaterally.  Kidneys: Normal enhancement and delayed excretion of contrast. Both ureters appear within normal limits.   Stomach:  Normal.  Small bowel:  Normal.  Colon: Borderline size of the appendix. There is mild periappendiceal fat stranding with appearance suggesting early acute appendicitis. Diameter the appendix is 8 mm. The appendix is retrocecal in position. Ileocecal valve appears normal. Moderate stool burden.  Pelvic Genitourinary: Physiologic appearance of the uterus and adnexa. Urinary bladder appears normal. No free fluid.  Bones:  No aggressive osseous lesions.  Vasculature: Normal.  Body Wall: Injection granulomata are present in the gluteal regions bilaterally. Tiny fat containing periumbilical hernia.  IMPRESSION: Borderline size of the appendix with mild periappendiceal fat stranding suspicious for early acute appendicitis. No perforation or abscess identified.   Electronically Signed   By: Andreas Newport M.D.   On: 05/09/2013 15:37    EKG Interpretation   None       MDM   1. Appendicitis      48 year old female with right lower quadrant pain. Provided timeline isn't necessarily keeping with appendicitis, but not sure how much may be potentially lost in translation. Patient is exquistely tender in the right lower quadrant, has leukocytosis and CT consistent with appy. Will discuss with surgery.    Raeford Razor, MD 05/11/13 [phone]

## 2013-05-09 NOTE — Op Note (Signed)
Vela ProseYen Berberian [id number]   PRE-OPERATIVE DIAGNOSIS:  acute appendicitis  POST-OPERATIVE DIAGNOSIS:  acute appendicitis  PROCEDURE:  APPENDECTOMY LAPAROSCOPIC  SURGEON:  Romie LeveeAlicia Jamaris Theard, MD  ASSISTANT: none   ANESTHESIA:   local and general  EBL:   30 ml  Delay start of Pharmacological VTE agent (>24hrs) due to surgical blood loss or risk of bleeding:  no  DRAINS: none   SPECIMEN:  Source of Specimen:  appendix  DISPOSITION OF SPECIMEN:  PATHOLOGY  COUNTS:  YES  PLAN OF CARE: Admit for overnight observation  PATIENT DISPOSITION:  PACU - hemodynamically stable.   INDICATIONS: Patient with concerning symptoms & work up suspicious for appendicitis.  Surgery was recommended:  The anatomy & physiology of the digestive tract was discussed.  The pathophysiology of appendicitis was discussed.  Natural history risks without surgery was discussed.   I feel the risks of no intervention will lead to serious problems that outweigh the operative risks; therefore, I recommended diagnostic laparoscopy with removal of appendix to remove the pathology.  Laparoscopic & open techniques were discussed.   I noted a good likelihood this will help address the problem.    Risks such as bleeding, infection, abscess, leak, reoperation, possible ostomy, hernia, heart attack, death, and other risks were discussed.  Goals of post-operative recovery were discussed as well.  We will work to minimize complications.  Questions were answered.  The patient expresses understanding & wishes to proceed with surgery.  OR FINDINGS: tip of appendix swollen, body of appendix erythematous   DESCRIPTION:   The patient was identified & brought into the operating room. The patient was positioned supine with left arm tucked. SCDs were active during the entire case. The patient underwent general anesthesia without any difficulty.  A foley catheter was inserted under sterile conditions. The abdomen was prepped and draped in a  sterile fashion. A Surgical Timeout confirmed our plan.   I made a transverse incision through the inferior umbilical fold.  I made a nick in the infraumbilical fascia and confirmed peritoneal entry.  I placed a stay suture and then the [facility] port.  We induced carbon dioxide insufflation.  Camera inspection revealed no injury.  I placed additional ports under direct laparoscopic visualization.  I mobilized the terminal ileum to proximal ascending colon in a lateral to medial fashion.  I took care to avoid injuring any retroperitoneal structures.   I freed the appendix off its attachments to the ascending colon and cecal mesentery.  I elevated the appendix. I skeletonized & ligated the mesoappendix. I was able to free off the base of the appendix which was still viable.  The base of the staple line was bleeding.  I place several 5mm clips and the bleeding stopped.  I stapled the appendix off the cecum using a laparoscopic stapler. I took a healthy cuff viable cecum.  I placed the appendix inside an [facility] bag and removed out the [place] [facility] [facility] port.  I did copious irrigation. Hemostasis was good in the mesoappendix, colon mesentery, and retroperitoneum. Staple line was intact on the cecum with no bleeding. I washed out the pelvis, retrohepatic space and right paracolic gutter. I washed out the left side as well.  Hemostasis is good. There was no perforation or injury.  Because the area cleaned up well after irrigation, I did not place a drain.  I aspirated the carbon dioxide. I removed the ports. I closed the umbilical fascia site using a 0 Vicryl stitch. I closed skin using 4-0 vicryl stitch.  Sterile dressings were applied.  Patient was extubated and sent to the recovery room.  I discussed the operative findings with the patient's  family. I suspect the patient is going used in the hospital at least overnight and will need antibiotics for 0 days. Questions answered. They expressed understanding and  appreciation.

## 2013-05-09 NOTE — Progress Notes (Signed)
[facility] provided pt with a [employer] application and a list of self-pay pcp.

## 2013-05-09 NOTE — ED Notes (Signed)
Bed: WA08 Expected date:  Expected time:  Means of arrival:  Comments: 

## 2013-05-09 NOTE — Anesthesia Postprocedure Evaluation (Signed)
  Anesthesia Post-op Note  Patient: Kimberly Blake  Procedure(s) Performed: Procedure(s) (LRB): APPENDECTOMY LAPAROSCOPIC (N/A)  Patient Location: PACU  Anesthesia Type: General  Level of Consciousness: awake and alert   Airway and Oxygen Therapy: Patient Spontanous Breathing  Post-op Pain: mild  Post-op Assessment: Post-op Vital signs reviewed, Patient's Cardiovascular Status Stable, Respiratory Function Stable, Patent Airway and No signs of Nausea or vomiting  Last Vitals:  Filed Vitals:   05/09/13 2232  BP: 113/57  Pulse: 62  Temp: 36.4 C  Resp:     Post-op Vital Signs: stable   Complications: No apparent anesthesia complications

## 2013-05-10 ENCOUNTER — Encounter (HOSPITAL_COMMUNITY): Payer: Self-pay | Admitting: General Surgery

## 2013-05-10 LAB — CBC
HCT: 35.1 % — ABNORMAL LOW (ref 36.0–46.0)
Hemoglobin: 11.3 g/dL — ABNORMAL LOW (ref 12.0–15.0)
MCH: 20 pg — ABNORMAL LOW (ref 26.0–34.0)
MCHC: 32.2 g/dL (ref 30.0–36.0)
MCV: 62.2 fL — ABNORMAL LOW (ref 78.0–100.0)
PLATELETS: 211 10*3/uL (ref 150–400)
RBC: 5.64 MIL/uL — AB (ref 3.87–5.11)
RDW: 14.3 % (ref 11.5–15.5)
WBC: 11.2 10*3/uL — AB (ref 4.0–10.5)

## 2013-05-10 MED ORDER — OXYCODONE-ACETAMINOPHEN 5-325 MG PO TABS: 1.0000 | ORAL_TABLET | Freq: Four times a day (QID) | ORAL | Status: DC | PRN

## 2013-05-10 MED ORDER — ACETAMINOPHEN 325 MG PO TABS: 650.0000 mg | ORAL_TABLET | Freq: Four times a day (QID) | ORAL | Status: DC | PRN

## 2013-05-10 NOTE — Progress Notes (Signed)
1 Day Post-Op  Subjective: Complains of mild pain  Objective: Vital signs in last 24 hours: Temp:  [97.5 F (36.4 C)-98.8 F (37.1 C)] 98 F (36.7 C) (01/08 0300) Pulse Rate:  [62-93] 90 (01/08 0300) Resp:  [16-20] 16 (01/08 0300) BP: (103-138)/(44-89) 109/50 mmHg (01/08 0300) SpO2:  [99 %-100 %] 100 % (01/08 0300) Weight:  [124 lb 8 oz (56.473 kg)-130 lb 1.1 oz (59 kg)] 124 lb 8 oz (56.473 kg) (01/08 0500) Last BM Date: 05/08/13  Intake/Output from previous day: 01/07 0701 - 01/08 0700 In: 1120 [P.O.:120; I.V.:1000] Out: 685 [Urine:675; Blood:10] Intake/Output this shift:    Appears comfortable Abdomen soft, non distended, incisions clean  Lab Results:   Recent Labs  05/09/13 1255  WBC 15.1*  HGB 12.5  HCT 39.0  PLT 204   BMET  Recent Labs  05/09/13 1255  NA 138  K 3.8  CL 98  CO2 25  GLUCOSE 97  BUN 16  CREATININE 0.63  CALCIUM 9.5   PT/INR No results found for this basename: LABPROT, INR,  in the last 72 hours ABG No results found for this basename: PHART, PCO2, PO2, HCO3,  in the last 72 hours  Studies/Results: Ct Abdomen Pelvis W Contrast  05/09/2013   CLINICAL DATA:  Right lower quadrant pain.  EXAM: CT ABDOMEN AND PELVIS WITH CONTRAST  TECHNIQUE: Multidetector CT imaging of the abdomen and pelvis was performed using the standard protocol following bolus administration of intravenous contrast.  CONTRAST:  100mL OMNIPAQUE IOHEXOL 300 MG/ML  SOLN  COMPARISON:  None.  FINDINGS: Lung Bases: Dependent atelectasis at the lung bases with a trace left pleural effusion and trace right pleural effusion.  Liver:  Normal.  Spleen:  Normal.  Gallbladder:  Normal.  Common bile duct:  Normal.  Pancreas:  Normal.  Adrenal glands:  Normal bilaterally.  Kidneys: Normal enhancement and delayed excretion of contrast. Both ureters appear within normal limits.  Stomach:  Normal.  Small bowel:  Normal.  Colon: Borderline size of the appendix. There is mild periappendiceal fat  stranding with appearance suggesting early acute appendicitis. Diameter the appendix is 8 mm. The appendix is retrocecal in position. Ileocecal valve appears normal. Moderate stool burden.  Pelvic Genitourinary: Physiologic appearance of the uterus and adnexa. Urinary bladder appears normal. No free fluid.  Bones:  No aggressive osseous lesions.  Vasculature: Normal.  Body Wall: Injection granulomata are present in the gluteal regions bilaterally. Tiny fat containing periumbilical hernia.  IMPRESSION: Borderline size of the appendix with mild periappendiceal fat stranding suspicious for early acute appendicitis. No perforation or abscess identified.   Electronically Signed   By: Andreas NewportGeoffrey  Lamke M.D.   On: 05/09/2013 15:37    Anti-infectives: Anti-infectives   Start     Dose/Rate Route Frequency Ordered Stop   05/09/13 1800  ertapenem (INVANZ) 1 g in sodium chloride 0.9 % 50 mL IVPB  Status:  Discontinued     1 g 100 mL/hr over 30 Minutes Intravenous Every 24 hours 05/09/13 1728 05/10/13 0020      Assessment/Plan: s/p Procedure(s): APPENDECTOMY LAPAROSCOPIC (N/A)  HR up a little and BP down.  Will check a CBC this morning Hopefully home after lunch  LOS: 1 day    Meital Riehl A 05/10/2013

## 2013-05-10 NOTE — Progress Notes (Signed)
Discharge instructions given to pt and dtr. Verbalized understanding. Left the unit in stable condition.

## 2013-05-10 NOTE — Progress Notes (Signed)
UR completed 

## 2013-05-10 NOTE — Discharge Instructions (Signed)
Laparoscopic Appendectomy Appendectomy is surgery to remove the appendix. Laparoscopic surgery uses several small cuts (incisions) instead of one large incision. Laparoscopic surgery offers a shorter recovery time and less discomfort. LET YOUR CAREGIVER KNOW ABOUT:  Allergies to food or medicine.  Medicines taken, including vitamins, dietary supplements, herbs, eyedrops, over-the-counter medicines, and creams.  Use of steroids (by mouth or creams).  Previous problems with anesthetics or numbing medicines.  History of bleeding problems or blood clots.  Previous surgery.  Other health problems, including diabetes, heart problems, lung problems, and kidney problems.  Possibility of pregnancy, if this applies. RISKS AND COMPLICATIONS  Infection. A germ starts growing in the wound. This can usually be treated with antibiotics. In some cases, the wound will need to be opened and cleaned.  Bleeding.  Damage to other organs.  Sores (abscesses).  Chronic pain at the incision sites. This is defined as pain that lasts for more than 3 months.  Blood clots in the legs that may rarely travel to the lungs.  Infection in the lungs (pneumonia). BEFORE THE PROCEDURE Appendectomy is usually performed immediately after an inflamed appendix (appendicitis) is diagnosed. No preparation is necessary ahead of this procedure. PROCEDURE  You will be given medicine that makes you sleep (general anesthetic). After you are asleep, a flexible tube (catheter) may be inserted into your bladder to drain your urine during surgery. The tube is removed before you wake up after surgery. When you are asleep, carbondioxide gas will be used to inflate your abdomen. This will allow your surgeon to see inside your abdomen and perform your surgery. Three small incisions will be made in your abdomen. Your surgeon will insert a thin, lighted tube (laparoscope) through one of the incisions. Your surgeon will look through the  laparoscope while performing the surgery. Other tools will be inserted through the other incisions. Laparoscopic procedures may not be appropriate when:  There is major scarring from a previous surgery.  The patient has bleeding disorders.  A pregnancy is near term.  There are other conditions which make the laparoscopic procedure impossible, such as an advanced infection or a ruptured appendix. If your surgeon feels it is not safe to continue with the laparoscopic procedure, he or she will perform an open surgery instead. This gives the surgeon a larger view and more space to work. Open surgery requires a longer recovery time. After your appendix is removed, your incisions will be closed with stitches (sutures) or skin adhesive. AFTER THE PROCEDURE You will be taken to a recovery room. When the anesthesia has worn off, you will be returned to your hospital room. You will be given pain medicines to keep you comfortable. Ask your caregiver how long your hospital stay will be. Document Released: 12/02/2003 Document Revised: 07/12/2011 Document Reviewed: 10/27/2010 [facility] Patient Information 2014 [place], [place].  C?t Ru?t Th?a b?ng N?i Soi ? B?ng, Ch?m [place] H?u Ph?u (Laparoscopic Appendectomy, Care After) Vui lng ??c t? thng tin ny trong vi tu?n t?i. H??ng d?n ny s? cung c?p cho b?n km theo thng tin chung v? cch ch?m [place] b?n thn sau khi lm th? thu?t. Chuyn gia ch?m [place] s?c kh?e c?ng c th? cung c?p cho b?n cc h??ng d?n c? th?. Bi?n php ?i?u tr? c?a b?n ? ???c ln k? ho?ch theo th?c hnh y khoa hi?n hnh, nh?ng cc v?n ?? ?i khi v?n x?y ra. N?u b?n c b?t c? v?n ?? ho?c cu h?i no sau khi lm th? thu?t, vui lng g?i  cho chuyn gia ch?m [place] s?c kh?e.  H??NG D?N CH?M [place] T?I [facility]  Khng li xe trong khi ?ang dng thu?c gi?m ?au gy ng?.  S? d?ng thu?c nhu?n trng, n?u b?n b? to bn do s? d?ng thu?c gi?m ?au.  Thay b?ng theo ch? d?n.  Gi? v?t th??ng kh v s?ch. V?t th??ng c th?  ???c r?a nh? nhng b?ng x phng v n??c. Nh? nhng th?m kh v?t th??ng b?ng kh?n s?ch.  Khng t?m b?n, s? d?ng b? b?i ho?c b?n t?m nng trong m??i ngy, ho?c theo ch? d?n c?a chuyn gia ch?m [place] s?c kh?e.  Ch? s? d?ng thu?c khng c?n k toa ho?c thu?c c?n k toa ?? gi?m ?au, gi?m c?m gic kh ch?u ho?c h? s?t theo ch? d?n c?a chuyn gia ch?m [place] s?c kh?e c?a b?n.  B?n c th? ti?p t?c ?n u?ng bnh th??ng, theo ch? d?n.  Khng nh?c qu 10 pao (4,5 kg) ln ho?c ch?i cc mn th? thao ti?p xc trong 3 tu?n ho?c theo ch? d?n.  B?n nn t? t? gia t?ng ho?t ??ng sau khi ph?u thu?t.  Hy ht th? su ?? trnh bi?n ch?ng sau ph?u thu?t (vim ph?i). HY ?I KHM N?U:  B?n b? t?y ??, s?ng ho?c ?au t?ng ? v?t th??ng.  C m? t? cc v?t th??ng.  C r? n??c ch?y ra t? v?t th??ng ko di h?n m?t ngy.  B?n th?y mi hi thot ra t? cc v?t th??ng ho?c b?ng.  Mi?ng v?t th??ng b? h ra sau khi tho ch?.  B?n nh?n th?y ?au gia t?ng ? vai (khu v?c dy ?eo vai) ho?c g?n b? vai.  B?n b? cc c?n chng m?t ho?c ng?t x?u khi ?ang ??ng.  B?n b? kh th?.  B?n b? bu?n nn ho?c nn m?a lin t?c.  B?n khng c ch?c n?ng ???ng ru?t ho?c khng dung n?p th?c ph?m.  B?n b? tiu ch?y. HY NGAY L?P T?C ?I KHM N?U:  B?n b? s?t.  B?n b? pht ban.  B?n b? kh th? ho?c ?au nhi ? ng?c.  B?n b? b?t k? ph?n ?ng ho?c tc d?ng ph? no v?i thu?c ???c cho dng. ??M B?O B?N:  Hi?u cc h??ng d?n ny.  S? theo di tnh tr?ng c?a mnh.  S? yu c?u tr? gip ngay l?p t?c n?u b?n c?m th?y khng ?? ho?c tnh tr?ng tr?m tr?ng. Document Released: 05/22/2010 Document Revised: 12/20/2012 [facility] Patient Information 2014 [place], [place].  [facility] ______[facility] LAPAROSCOPIC SURGERY: POST OP INSTRUCTIONS Always review your discharge instruction sheet given to you by the facility where your surgery was performed. IF YOU HAVE DISABILITY OR FAMILY LEAVE FORMS, YOU MUST BRING THEM TO THE OFFICE FOR  PROCESSING.   DO NOT GIVE THEM TO YOUR DOCTOR.  1. A prescription for pain medication may be given to you upon discharge.  Take your pain medication as prescribed, if needed.  If narcotic pain medicine is not needed, then you may take acetaminophen (Tylenol) or ibuprofen (Advil) as needed. 2. Take your usually prescribed medications unless otherwise directed. 3. If you need a refill on your pain medication, please contact your pharmacy.  They will contact our office to request authorization. Prescriptions will not be filled after 5pm or on week-ends. 4. You should follow a light diet the first few days after arrival home, such as soup and crackers, etc.  Be sure to include lots of fluids daily. 5. Most patients will experience some swelling  and bruising in the area of the incisions.  Ice packs will help.  Swelling and bruising can take several days to resolve.  6. It is common to experience some constipation if taking pain medication after surgery.  Increasing fluid intake and taking a stool softener (such as Colace) will usually help or prevent this problem from occurring.  A mild laxative (Milk of Magnesia or Miralax) should be taken according to package instructions if there are no bowel movements after 48 hours. 7. Unless discharge instructions indicate otherwise, you may remove your bandages 24-48 hours after surgery, and you may shower at that time.  You may have steri-strips (small skin tapes) in place directly over the incision.  These strips should be left on the skin for 7-10 days.  If your surgeon used skin glue on the incision, you may shower in 24 hours.  The glue will flake off over the next 2-3 weeks.  Any sutures or staples will be removed at the office during your follow-up visit. 8. ACTIVITIES:  You may resume regular (light) daily activities beginning the next day--such as daily self-care, walking, climbing stairs--gradually increasing activities as tolerated.  You may have sexual  intercourse when it is comfortable.  Refrain from any heavy lifting or straining until approved by your doctor. a. You may drive when you are no longer taking prescription pain medication, you can comfortably wear a seatbelt, and you can safely maneuver your car and apply brakes. b. RETURN TO WORK:  __________________________________________________________ 9. You should see your doctor in the office for a follow-up appointment approximately 2-3 weeks after your surgery.  Make sure that you call for this appointment within a day or two after you arrive home to insure a convenient appointment time. 10. OTHER INSTRUCTIONS: __________________________________________________________________________________________________________________________ __________________________________________________________________________________________________________________________ WHEN TO CALL YOUR DOCTOR: 1. Fever over 101.0 2. Inability to urinate 3. Continued bleeding from incision. 4. Increased pain, redness, or drainage from the incision. 5. Increasing abdominal pain  The clinic staff is available to answer your questions during regular business hours.  Please dont hesitate to call and ask to speak to one of the nurses for clinical concerns.  If you have a medical emergency, go to the nearest emergency room or call 911.  A surgeon from [facility] is always on call at the hospital. [address], [address], [place], [place]  [address] ? [address] [address], [place], [place]   [address] [phone] ? [phone] ? FAX [phone] Web site: [email or web address]

## 2013-05-11 NOTE — Discharge Summary (Signed)
Physician Discharge Summary  Patient ID: Kimberly Blake MRN: [id number] DOB/AGE: [birth year] 48 y.o.  Admit date: 05/09/2013 Discharge date: 05/11/2013  Admission Diagnoses: Acute appendicitis   Discharge Diagnoses: Same Active Problems:   Appendicitis   PROCEDURES: APPENDECTOMY LAPAROSCOPIC 05/09/2013, Romie LeveeAlicia Thomas, MD    Hospital Course: The pt is a 48yo female [place]. She comes to the ER today with a 1 month history of RLQ pain. The family is not sure if she has had any nausea or vomiting. She denies fever. A CT scan was done that showed acute appendicitis but no evidence of perforation.  She has some family that spoke some [place], but language was a barrier.  She agreed to surgery after being seen by Dr. Carolynne Edouardoth in the ER.  She did well after surgery, it took some effort to get her to eat and mobilize, but she was ready for d/c after supper the 1st post op night.   Disposition: 01-Home or Self Care   Future Appointments Provider Department Dept Phone   05/29/2013 3:45 PM [facility] [place] [phone]       Medication List         acetaminophen 325 MG tablet  Commonly known as:  TYLENOL  Take 2 tablets (650 mg total) by mouth every 6 (six) hours as needed (You cannot take more than 4000 mg of Tylenol (acetaminophen) every 24 hours.  This is in your prescribed pain medicine, so you must add it into daily total).     oxyCODONE-acetaminophen 5-325 MG per tablet  Commonly known as:  PERCOCET/ROXICET  Take 1-2 tablets by mouth every 6 (six) hours as needed for severe pain.           Follow-up Information   Follow up with [facility] Week [place] On 05/29/2013. (Your appointment is at 3:45, be at the office for check in at 3:15 PM)    Contact information:   [address]   [place] [address] [phone]       Signed: Sherrie GeorgeJENNINGS,Ivann Trimarco 05/11/2013, 5:44 PM

## 2013-05-29 ENCOUNTER — Encounter (INDEPENDENT_AMBULATORY_CARE_PROVIDER_SITE_OTHER): Payer: Self-pay

## 2013-05-30 ENCOUNTER — Encounter (INDEPENDENT_AMBULATORY_CARE_PROVIDER_SITE_OTHER): Payer: Self-pay

## 2016-11-08 ENCOUNTER — Encounter (HOSPITAL_COMMUNITY): Payer: Self-pay

## 2016-11-08 ENCOUNTER — Emergency Department (HOSPITAL_COMMUNITY): Payer: Medicaid Other

## 2016-11-08 DIAGNOSIS — R946 Abnormal results of thyroid function studies: Secondary | ICD-10-CM | POA: Insufficient documentation

## 2016-11-08 DIAGNOSIS — Z79899 Other long term (current) drug therapy: Secondary | ICD-10-CM | POA: Insufficient documentation

## 2016-11-08 DIAGNOSIS — R079 Chest pain, unspecified: Secondary | ICD-10-CM | POA: Insufficient documentation

## 2016-11-08 DIAGNOSIS — K279 Peptic ulcer, site unspecified, unspecified as acute or chronic, without hemorrhage or perforation: Secondary | ICD-10-CM | POA: Insufficient documentation

## 2016-11-08 DIAGNOSIS — I10 Essential (primary) hypertension: Secondary | ICD-10-CM | POA: Insufficient documentation

## 2016-11-08 LAB — CBC
HEMATOCRIT: 38.5 % (ref 36.0–46.0)
Hemoglobin: 12.1 g/dL (ref 12.0–15.0)
MCH: 20.1 pg — ABNORMAL LOW (ref 26.0–34.0)
MCHC: 31.4 g/dL (ref 30.0–36.0)
MCV: 63.8 fL — ABNORMAL LOW (ref 78.0–100.0)
Platelets: 203 10*3/uL (ref 150–400)
RBC: 6.03 MIL/uL — ABNORMAL HIGH (ref 3.87–5.11)
RDW: 15 % (ref 11.5–15.5)
WBC: 7.3 10*3/uL (ref 4.0–10.5)

## 2016-11-08 LAB — I-STAT TROPONIN, ED: TROPONIN I, POC: 0.01 ng/mL (ref 0.00–0.08)

## 2016-11-08 LAB — BASIC METABOLIC PANEL
ANION GAP: 8 (ref 5–15)
BUN: 12 mg/dL (ref 6–20)
CALCIUM: 9 mg/dL (ref 8.9–10.3)
CO2: 25 mmol/L (ref 22–32)
Chloride: 107 mmol/L (ref 101–111)
Creatinine, Ser: 0.69 mg/dL (ref 0.44–1.00)
GFR calc Af Amer: >60 mL/min (ref >60)
GFR calc non Af Amer: >60 mL/min (ref >60)
GLUCOSE: 165 mg/dL — AB (ref 65–99)
POTASSIUM: 3.8 mmol/L (ref 3.5–5.1)
SODIUM: 140 mmol/L (ref 135–145)

## 2016-11-08 NOTE — ED Triage Notes (Signed)
Pt. Having chest pain for over 4 months along with a cough.  Pt. Was seen a [facility] and diagnosed with HTN and GERD.  She continues to have the pain.   Skin is warm and dry.  Pt. Is also having nausea and vomiting.  She denies any diarrhea  Pt. Require interrupter.    The cough is constant and dry.

## 2016-11-09 ENCOUNTER — Emergency Department (HOSPITAL_COMMUNITY)
Admission: EM | Admit: 2016-11-09 | Discharge: 2016-11-09 | Disposition: A | Discharge status: Home / Self Care | Payer: Medicaid Other | Attending: Emergency Medicine | Admitting: Emergency Medicine

## 2016-11-09 DIAGNOSIS — K279 Peptic ulcer, site unspecified, unspecified as acute or chronic, without hemorrhage or perforation: Secondary | ICD-10-CM

## 2016-11-09 DIAGNOSIS — R7989 Other specified abnormal findings of blood chemistry: Secondary | ICD-10-CM

## 2016-11-09 DIAGNOSIS — R079 Chest pain, unspecified: Secondary | ICD-10-CM

## 2016-11-09 HISTORY — DX: Essential (primary) hypertension: I10

## 2016-11-09 HISTORY — DX: Gastro-esophageal reflux disease without esophagitis: K21.9

## 2016-11-09 LAB — TSH: TSH: 0.019 u[IU]/mL — AB (ref 0.350–4.500)

## 2016-11-09 LAB — I-STAT TROPONIN, ED: TROPONIN I, POC: 0 ng/mL (ref 0.00–0.08)

## 2016-11-09 LAB — T4, FREE: FREE T4: 1.41 ng/dL — AB (ref 0.61–1.12)

## 2016-11-09 LAB — BRAIN NATRIURETIC PEPTIDE: B Natriuretic Peptide: 8 pg/mL (ref 0.0–100.0)

## 2016-11-09 MED ORDER — GI COCKTAIL ~~LOC~~
30.0000 mL | Freq: Once | ORAL | Status: AC
  Administered 2016-11-09: 30 mL via ORAL
  Filled 2016-11-09: qty 30

## 2016-11-09 MED ORDER — DICYCLOMINE HCL 10 MG PO CAPS
10.0000 mg | ORAL_CAPSULE | Freq: Once | ORAL | Status: AC
  Administered 2016-11-09: 10 mg via ORAL
  Filled 2016-11-09: qty 1

## 2016-11-09 MED ORDER — OMEPRAZOLE 20 MG PO CPDR
20.0000 mg | DELAYED_RELEASE_CAPSULE | Freq: Every day | ORAL | 0 refills | Status: AC
Start: 2016-11-09 — End: ?

## 2016-11-09 NOTE — ED Provider Notes (Signed)
[facility]-EMERGENCY DEPT Provider Note   CSN: [id number] Arrival date & time: 11/08/16  1608   By signing my name below, I, Clarisse GougeXavier Herndon, attest that this documentation has been prepared under the direction and in the presence of Derwood KaplanNanavati, Dorie Ohms, MD. Electronically signed, Clarisse GougeXavier Herndon, ED Scribe. 11/09/16. 2:05 AM.   History   Chief Complaint Chief Complaint  Patient presents with  . Shortness of Breath  . Chest Pain  . Cough   The history is provided by a relative and medical records. A language interpreter was used (Relative acts as interpretor).  Shortness of Breath  This is a recurrent problem. The average episode lasts 4 days. The problem occurs continuously.The current episode started more than 2 days ago. The problem has been gradually worsening. Associated symptoms include headaches, cough and chest pain. Pertinent negatives include no abdominal pain (none currently). It is unknown what precipitated the problem. She has had no prior hospitalizations. She has had no prior ED visits. She has had no prior ICU admissions. Associated medical issues do not include asthma.  Chest Pain   This is a recurrent problem. The current episode started more than 2 days ago. The problem occurs constantly. The problem has been gradually worsening. The pain is present in the substernal region. The pain is at a severity of 10/10. The pain is severe. The quality of the pain is described as sharp. The pain radiates to the right shoulder and left shoulder. Duration of episode(s) is 4 days. The symptoms are aggravated by deep breathing. Associated symptoms include cough, headaches, malaise/fatigue, nausea and shortness of breath. Pertinent negatives include no abdominal pain (none currently). Risk factors include being elderly.  Cough  The cough is non-productive. Associated symptoms include chest pain, headaches and shortness of breath. She is not a smoker. Her past medical history does not include asthma.    Kimberly ProseYen Song is a 51 y.o. female with h/o DM presenting to the Emergency Department concerning worsened chest pain x 4 days. She states this has been a recurring issue x 1 year, and it has been getting worse over the last 3 . Months. She describes 10/10 mid chest pain constantly that radiates to bilateral shoulders. No known modifying factors; pt states eating does not exacerbate her pain, nor does exertion, deep inspiration. Associated fatigue, SOB when laying down, abdominal pain, cough, N/V and subjective fever. Pt states SOB wakes her at night, and her SOB is exacerbated with laying flat on her back. NL appetite noted. Pt evaluated by PCP for this issue in the past twice; the last occurrence of similar severity symptoms was last month. Pt diagnosed with HTN and GERD at this time and prescribed medications. Pt taking multiple prescribed medications including amlodipine. Pt states similar pain comes on every 2-3 days briefly historically, but for the past 3 months it has been constant during episodes for days at a time. Pt's mother had a stroke ~age 760-70. No tobacco use. PCP noted for F/U in [place]. No recent long distance travels. No other complaints at this time.   Past Medical History:  Diagnosis Date  . GERD (gastroesophageal reflux disease)   . Hypertension   . Medical history non-contributory     Patient Active Problem List   Diagnosis Date Noted  . Appendicitis 05/09/2013    Past Surgical History:  Procedure Laterality Date  . LAPAROSCOPIC APPENDECTOMY N/A 05/09/2013   Procedure: APPENDECTOMY LAPAROSCOPIC;  Surgeon: Romie LeveeAlicia Thomas, MD;  Location: [facility] ORS;  Service: General;  Laterality: N/A;  .  NO PAST SURGERIES      OB History    No data available       Home Medications    Prior to Admission medications   Medication Sig Start Date End Date Taking? Authorizing Provider  amLODipine (NORVASC) 5 MG tablet Take 5 mg by mouth daily.   Yes [provider]   butalbital-acetaminophen-caffeine (FIORICET, ESGIC) 50-325-40 MG tablet Take 1 tablet by mouth every 6 (six) hours as needed for headache.   Yes [provider]  omeprazole (PRILOSEC) 20 MG capsule Take 1 capsule (20 mg total) by mouth daily. 11/09/16   Derwood Kaplan, MD    Family History No family history on file.  Social History Social History  Substance Use Topics  . Smoking status: Never Smoker  . Smokeless tobacco: Never Used  . Alcohol use No     Allergies   Patient has no known allergies.   Review of Systems Review of Systems  Constitutional: Positive for malaise/fatigue.  Respiratory: Positive for cough and shortness of breath.   Cardiovascular: Positive for chest pain.  Gastrointestinal: Positive for nausea. Negative for abdominal pain (none currently).  Neurological: Positive for headaches.  All other systems reviewed and are negative.    Physical Exam Updated Vital Signs BP 128/62   Pulse 84   Temp 98.3 F (36.8 C) (Oral)   Resp 14   LMP 08/09/2016   SpO2 99%   Physical Exam  Constitutional: She is oriented to person, place, and time. She appears well-developed and well-nourished.  HENT:  Head: Normocephalic.  Eyes: EOM are normal.  Neck: Normal range of motion.  Cardiovascular: Normal rate and regular rhythm.   Pulses:      Radial pulses are 2+ on the right side, and 2+ on the left side.  RRR. 2+ equal radial pulse bilaterally.  Pulmonary/Chest: Effort normal and breath sounds normal.  Abdominal: Soft. She exhibits no distension. There is no tenderness.  Abdomen soft, nontender  Musculoskeletal: Normal range of motion.  Neurological: She is alert and oriented to person, place, and time.  Psychiatric: She has a normal mood and affect.  Nursing note and vitals reviewed.    ED Treatments / Results  DIAGNOSTIC STUDIES: Oxygen Saturation is 99% on RA, NL by my interpretation.    COORDINATION OF CARE: 1:56 AM-Discussed next steps  with relatives. They verbalized understanding and is agreeable with the plan. Will order blood work. Will refer to GI specialist.   Labs (all labs ordered are listed, but only abnormal results are displayed) Labs Reviewed  BASIC METABOLIC PANEL - Abnormal; Notable for the following:       Result Value   Glucose, Bld 165 (*)    All other components within normal limits  CBC - Abnormal; Notable for the following:    RBC 6.03 (*)    MCV 63.8 (*)    MCH 20.1 (*)    All other components within normal limits  TSH - Abnormal; Notable for the following:    TSH 0.019 (*)    All other components within normal limits  BRAIN NATRIURETIC PEPTIDE  T3, FREE  T4, FREE  I-STAT TROPOININ, ED  I-STAT TROPOININ, ED    EKG  EKG Interpretation  Date/Time:  Monday November 08 2016 16:23:59 EDT Ventricular Rate:  95 PR Interval:  140 QRS Duration: 82 QT Interval:  362 QTC Calculation: 454 R Axis:   75 Text Interpretation:  Normal sinus rhythm Cannot rule out Anterior infarct , age undetermined Abnormal  ECG No old tracing to compare Confirmed by Dione Booze ([id number]) on 11/09/2016 12:50:51 AM       Radiology Dg Chest 2 View  Result Date: 11/08/2016 CLINICAL DATA:  51 y/o F; central chest pain, cough, shortness of breath, congestion. EXAM: CHEST  2 VIEW COMPARISON:  None. FINDINGS: Mildly enlarged cardiac silhouette. Interstitial prominence. No focal consolidation. No pleural effusion or pneumothorax. No consolidation. Bones are unremarkable. IMPRESSION: Mild cardiomegaly. Interstitial prominence may represent pulmonary vascular congestion. No focal consolidation. Electronically Signed   By: Mitzi Hansen M.D.   On: 11/08/2016 17:14    Procedures Procedures (including critical care time)  Medications Ordered in ED Medications  dicyclomine (BENTYL) capsule 10 mg (10 mg Oral Given 11/09/16 0214)  gi cocktail (Maalox,Lidocaine,Donnatal) (30 mLs Oral Given 11/09/16 0214)     Initial  Impression / Assessment and Plan / ED Course  I have reviewed the triage vital signs and the nursing notes.  Pertinent labs & imaging results that were available during my care of the patient were reviewed by me and considered in my medical decision making (see chart for details).  Clinical Course as of Nov 09 412  Tue Nov 09, 2016  0413 TSH is low, BNP and trops are neg.  Results from the ER workup discussed with the patient face to face and all questions answered to the best of my ability.  Will ask pt to see pcp for TSH and GI   TSH: (!) 0.019 [AN]    Clinical Course User Index [AN] Derwood Kaplan, MD   Pt comes in with cc of chest pain. Pt has midsternal chest pain that is radiating to her back. The pain has been present for 1 year, and has gotten worse over the last 3 months and has been constant the last 4 days. Pt has no associated cough, dib and the pain is not exertional or pleuritic or positional. Pt does however indicate that she has  Orthopnea like symptoms. Hx of HTN, DM. She was started on omeprazole, amlodipine recently. Pain doesn't sound typical of ischemic cardiac pain at all, still will get trops x 2 as she is due for her 2nd trop. With the orthopnea / PND like symptoms, we will get BNP. CXR does show mild vascular congestion. Besides new onset CHF, ddx includes PUD, esophageal spasms.   Final Clinical Impressions(s) / ED Diagnoses   Final diagnoses:  PUD (peptic ulcer disease)  Chest pain, unspecified type  Low serum thyroid stimulating hormone (TSH)    New Prescriptions New Prescriptions   OMEPRAZOLE (PRILOSEC) 20 MG CAPSULE    Take 1 capsule (20 mg total) by mouth daily.  I personally performed the services described in this documentation, which was scribed in my presence. The recorded information has been reviewed and is accurate.    Derwood Kaplan, MD 11/09/16 [phone]

## 2016-11-09 NOTE — Discharge Instructions (Signed)
You might have hyperthyroidism according to our labs. No signs of congestive heart failure.  Please see your doctor again for thyroid evaluation. Please see the GI doctor for the pain as well.

## 2016-11-10 LAB — T3, FREE: T3, Free: 7.7 pg/mL — ABNORMAL HIGH (ref 2.0–4.4)

## 2016-11-17 ENCOUNTER — Emergency Department: Payer: Medicaid Other

## 2016-11-17 ENCOUNTER — Encounter: Payer: Self-pay | Admitting: Emergency Medicine

## 2016-11-17 ENCOUNTER — Other Ambulatory Visit: Payer: Self-pay

## 2016-11-17 ENCOUNTER — Emergency Department
Admission: EM | Admit: 2016-11-17 | Discharge: 2016-11-17 | Disposition: A | Discharge status: Home / Self Care | Payer: Medicaid Other | Attending: Emergency Medicine | Admitting: Emergency Medicine

## 2016-11-17 DIAGNOSIS — R109 Unspecified abdominal pain: Secondary | ICD-10-CM | POA: Diagnosis present

## 2016-11-17 DIAGNOSIS — Z79899 Other long term (current) drug therapy: Secondary | ICD-10-CM | POA: Insufficient documentation

## 2016-11-17 DIAGNOSIS — R0600 Dyspnea, unspecified: Secondary | ICD-10-CM | POA: Diagnosis not present

## 2016-11-17 DIAGNOSIS — R1011 Right upper quadrant pain: Secondary | ICD-10-CM | POA: Insufficient documentation

## 2016-11-17 DIAGNOSIS — I10 Essential (primary) hypertension: Secondary | ICD-10-CM | POA: Diagnosis not present

## 2016-11-17 LAB — COMPREHENSIVE METABOLIC PANEL
ALT: 34 U/L (ref 14–54)
AST: 27 U/L (ref 15–41)
Albumin: 4 g/dL (ref 3.5–5.0)
Alkaline Phosphatase: 60 U/L (ref 38–126)
Anion gap: 8 (ref 5–15)
BUN: 12 mg/dL (ref 6–20)
CHLORIDE: 106 mmol/L (ref 101–111)
CO2: 27 mmol/L (ref 22–32)
CREATININE: 0.7 mg/dL (ref 0.44–1.00)
Calcium: 9.4 mg/dL (ref 8.9–10.3)
GFR calc Af Amer: >60 mL/min (ref >60)
GLUCOSE: 122 mg/dL — AB (ref 65–99)
Potassium: 4.3 mmol/L (ref 3.5–5.1)
Sodium: 141 mmol/L (ref 135–145)
Total Bilirubin: 0.4 mg/dL (ref 0.3–1.2)
Total Protein: 7.5 g/dL (ref 6.5–8.1)

## 2016-11-17 LAB — CBC WITH DIFFERENTIAL/PLATELET
BASOS ABS: 0.1 10*3/uL (ref 0–0.1)
Basophils Relative: 1 %
EOS PCT: 3 %
Eosinophils Absolute: 0.2 10*3/uL (ref 0–0.7)
HCT: 39.7 % (ref 35.0–47.0)
Hemoglobin: 12.4 g/dL (ref 12.0–16.0)
LYMPHS PCT: 23 %
Lymphs Abs: 2.1 10*3/uL (ref 1.0–3.6)
MCH: 19.6 pg — ABNORMAL LOW (ref 26.0–34.0)
MCHC: 31.2 g/dL — ABNORMAL LOW (ref 32.0–36.0)
MCV: 62.8 fL — AB (ref 80.0–100.0)
Monocytes Absolute: 0.8 10*3/uL (ref 0.2–0.9)
Monocytes Relative: 8 %
NEUTROS ABS: 6.2 10*3/uL (ref 1.4–6.5)
Neutrophils Relative %: 65 %
PLATELETS: 190 10*3/uL (ref 150–440)
RBC: 6.32 MIL/uL — AB (ref 3.80–5.20)
RDW: 14.8 % — ABNORMAL HIGH (ref 11.5–14.5)
WBC: 9.4 10*3/uL (ref 3.6–11.0)

## 2016-11-17 LAB — BRAIN NATRIURETIC PEPTIDE: B Natriuretic Peptide: 28 pg/mL (ref 0.0–100.0)

## 2016-11-17 LAB — LACTIC ACID, PLASMA: Lactic Acid, Venous: 1 mmol/L (ref 0.5–1.9)

## 2016-11-17 LAB — TROPONIN I: Troponin I: <0.03 ng/mL (ref <0.03)

## 2016-11-17 LAB — LIPASE, BLOOD: LIPASE: 27 U/L (ref 11–51)

## 2016-11-17 LAB — POCT PREGNANCY, URINE: PREG TEST UR: NEGATIVE

## 2016-11-17 MED ORDER — IOPAMIDOL (ISOVUE-300) INJECTION 61%
75.0000 mL | Freq: Once | INTRAVENOUS | Status: AC | PRN
  Administered 2016-11-17: 75 mL via INTRAVENOUS

## 2016-11-17 MED ORDER — OXYCODONE-ACETAMINOPHEN 5-325 MG PO TABS
1.0000 | ORAL_TABLET | Freq: Once | ORAL | Status: AC
  Administered 2016-11-17: 1 via ORAL
  Filled 2016-11-17: qty 1

## 2016-11-17 MED ORDER — ONDANSETRON 4 MG PO TBDP
4.0000 mg | ORAL_TABLET | Freq: Once | ORAL | Status: AC
  Administered 2016-11-17: 4 mg via ORAL
  Filled 2016-11-17: qty 1

## 2016-11-17 MED ORDER — ONDANSETRON 4 MG PO TBDP: 4.0000 mg | ORAL_TABLET | Freq: Three times a day (TID) | ORAL | 0 refills | Status: AC | PRN

## 2016-11-17 MED ORDER — ONDANSETRON HCL 4 MG/2ML IJ SOLN
4.0000 mg | Freq: Once | INTRAMUSCULAR | Status: AC
  Administered 2016-11-17: 4 mg via INTRAVENOUS
  Filled 2016-11-17: qty 2

## 2016-11-17 MED ORDER — GI COCKTAIL ~~LOC~~
30.0000 mL | Freq: Once | ORAL | Status: AC
  Administered 2016-11-17: 30 mL via ORAL
  Filled 2016-11-17: qty 30

## 2016-11-17 MED ORDER — IOPAMIDOL (ISOVUE-370) INJECTION 76%
75.0000 mL | Freq: Once | INTRAVENOUS | Status: AC | PRN
  Administered 2016-11-17: 75 mL via INTRAVENOUS

## 2016-11-17 MED ORDER — MORPHINE SULFATE (PF) 2 MG/ML IV SOLN
2.0000 mg | Freq: Once | INTRAVENOUS | Status: AC
  Administered 2016-11-17: 2 mg via INTRAVENOUS
  Filled 2016-11-17: qty 1

## 2016-11-17 MED ORDER — SODIUM CHLORIDE 0.9 % IV BOLUS (SEPSIS)
2000.0000 mL | Freq: Once | INTRAVENOUS | Status: AC
Start: 2016-11-17 — End: 2016-11-17
  Administered 2016-11-17: 2000 mL via INTRAVENOUS

## 2016-11-17 MED ORDER — MORPHINE SULFATE (PF) 4 MG/ML IV SOLN
4.0000 mg | Freq: Once | INTRAVENOUS | Status: AC
  Administered 2016-11-17: 4 mg via INTRAVENOUS
  Filled 2016-11-17: qty 1

## 2016-11-17 MED ORDER — OXYCODONE-ACETAMINOPHEN 5-325 MG PO TABS: 1.0000 | ORAL_TABLET | ORAL | 0 refills | Status: AC | PRN

## 2016-11-17 MED ORDER — IOPAMIDOL (ISOVUE-300) INJECTION 61%
30.0000 mL | Freq: Once | INTRAVENOUS | Status: AC | PRN
  Administered 2016-11-17: 30 mL via ORAL

## 2016-11-17 NOTE — ED Triage Notes (Addendum)
Pt to triage via w/c, appears uncomfortable, grimacing; daughter reports mid epigastric pain since yesterday accomp by N/V; pain nonradiating; st hx of same with no dx; taken to room 14 by EDT for further eval

## 2016-11-17 NOTE — ED Notes (Signed)
Daughter at bedside.

## 2016-11-17 NOTE — Discharge Instructions (Signed)
Please drink plenty of fluid to stay well-hydrated. Please eat a bland diet without any spice and in small portions. Please make a follow-up appointment with a primary care physician for further evaluation and treatment.  Return to the emergency department if you develop severe pain, inability to keep down fluids, fever, or any other symptoms concerning to you.

## 2016-11-17 NOTE — ED Provider Notes (Signed)
This patient was signed out to me by Dr. Manson PasseyBrown. 51 year old woman presenting with epigastric pain, nausea and vomiting. She was [facility] at [facility] with a reassuring workup. In the emergency department the patient has had table vital signs throughout the entirety of her stay. In addition she has reassuring blood work with a normal white blood cell count and otherwise normal evaluation including lactic acid. She has had a right upper quadrant ultrasound which was reassuring, and a CT scan which does not show any acute cause for her pain. She also underwent CT of the chest to rule out PE which was negative. She has had a cardiac workup with a negative BNP and negative troponin, with an EKG that shows no ischemic changes. At this time, the patient continues to be tearful but there are no acute findings suggestive of emergency pathology which would require surgery or admission to the hospital. At this time I'll plan to discharge the patient home and have her establish a primary care physician [facility] as soon as possible for follow-up. She'll be discharged with symptomatic treatment. Return precautions as well as follow-up instructions were discussed with the patient and her daughter.   Rockne MenghiniNorman, Anne-Caroline, MD 11/17/16 [phone]

## 2016-11-17 NOTE — ED Provider Notes (Signed)
[facility] Emergency Department Provider Note    First MD Initiated Contact with Patient 11/17/16 [phone]     (approximate)  I have reviewed the triage vital signs and the nursing notes.  History obtained from the patient's daughter at bedside who is interpreting HISTORY  Chief Complaint Abdominal Pain    HPI Kimberlea Schlag is a 51 y.o. female presents to the emergency department with epigastric abdominal pain with onset yesterday accompanied by nausea and vomiting. Patient states that the pain is nonradiating. Patient denies any diarrhea no constipation no fever afebrile on presentation. Of note patient was seen at [facility] on 11/09/2016 for the same and diagnosed with nonspecific chest pain and peptic ulcer disease. Patient also admits to worsening dyspnea with laying flat oh (orthopnea.)   Past Medical History:  Diagnosis Date  . GERD (gastroesophageal reflux disease)   . Hypertension   . Medical history non-contributory     Patient Active Problem List   Diagnosis Date Noted  . Appendicitis 05/09/2013    Past Surgical History:  Procedure Laterality Date  . LAPAROSCOPIC APPENDECTOMY N/A 05/09/2013   Procedure: APPENDECTOMY LAPAROSCOPIC;  Surgeon: Romie Levee, MD;  Location: [facility];  Service: General;  Laterality: N/A;  . NO PAST SURGERIES      Prior to Admission medications   Medication Sig Start Date End Date Taking? Authorizing Provider  amLODipine (NORVASC) 5 MG tablet Take 5 mg by mouth daily.   Yes [provider]  butalbital-acetaminophen-caffeine (FIORICET, ESGIC) 50-325-40 MG tablet Take 1 tablet by mouth every 6 (six) hours as needed for headache.   Yes [provider]  omeprazole (PRILOSEC) 20 MG capsule Take 1 capsule (20 mg total) by mouth daily. 11/09/16  Yes Derwood Kaplan, MD    Allergies No known drug allergies No family history on file.  Social History Social History  Substance Use Topics  .  Smoking status: Never Smoker  . Smokeless tobacco: Never Used  . Alcohol use No    Review of Systems Constitutional: No fever/chills Eyes: No visual changes. ENT: No sore throat. Cardiovascular: Denies chest pain. Respiratory: Denies shortness of breath. Gastrointestinal: Positive for epigastric abdominal pain.  Positive for nausea and vomiting Genitourinary: Negative for dysuria. Musculoskeletal: Negative for neck pain.  Negative for back pain. Integumentary: Negative for rash. Neurological: Negative for headaches, focal weakness or numbness.  ____________________________________________   PHYSICAL EXAM:  VITAL SIGNS: ED Triage Vitals  Enc Vitals Group     BP 11/17/16 0333 118/61     Pulse Rate 11/17/16 0333 85     Resp 11/17/16 0333 18     Temp 11/17/16 0333 97.9 F (36.6 C)     Temp Source 11/17/16 0333 Oral     SpO2 11/17/16 0333 97 %     Weight 11/17/16 0330 63.5 kg (140 lb)     Height 11/17/16 0330 1.524 m (5')     Head Circumference --      Peak Flow --      Pain Score 11/17/16 0331 10     Pain Loc --      Pain Edu? --      Excl. in GC? --     Constitutional: Alert and oriented. Well appearing and in no acute distress. Eyes: Conjunctivae are normal.  Head: Atraumatic. Mouth/Throat: Mucous membranes are moist.  Oropharynx non-erythematous. Neck: No stridor. Cardiovascular: Normal rate, regular rhythm. Good peripheral circulation. Grossly normal heart sounds. Respiratory: Normal respiratory effort.  No retractions. Lungs  CTAB. Gastrointestinal: Soft and nontender. No distention.  Musculoskeletal: No lower extremity tenderness nor edema. No gross deformities of extremities. Neurologic:  Normal speech and language. No gross focal neurologic deficits are appreciated.  Skin:  Skin is warm, dry and intact. No rash noted. Psychiatric: Mood and affect are normal. Speech and behavior are normal.  ____________________________________________   LABS (all labs  ordered are listed, but only abnormal results are displayed)  Labs Reviewed  CBC WITH DIFFERENTIAL/PLATELET - Abnormal; Notable for the following:       Result Value   RBC 6.32 (*)    MCV 62.8 (*)    MCH 19.6 (*)    MCHC 31.2 (*)    RDW 14.8 (*)    All other components within normal limits  COMPREHENSIVE METABOLIC PANEL - Abnormal; Notable for the following:    Glucose, Bld 122 (*)    All other components within normal limits  LIPASE, BLOOD  TROPONIN I  BRAIN NATRIURETIC PEPTIDE   ____________________________________________  EKG  ED ECG REPORT I, [place] N Kaydee Magel, the attending physician, personally viewed and interpreted this ECG.   Date: 11/17/2016  EKG Time: 3:31 AM  Rate: 79  Rhythm: Normal sinus rhythm  Axis: Normal  Intervals: Normal  ST&T Change: None  ____________________________________________  RADIOLOGY I, [place] N Jeniel Slauson, personally viewed and evaluated these images (plain radiographs) as part of my medical decision making, as well as reviewing the written report by the radiologist.  Dg Chest 2 View  Result Date: 11/17/2016 CLINICAL DATA:  Cough and chest pain EXAM: CHEST  2 VIEW COMPARISON:  Chest radiograph 11/08/2016 FINDINGS: Unchanged mild cardiomegaly. Shallow lung inflation without pulmonary edema or consolidation. No pneumothorax or pleural effusion. IMPRESSION: No active cardiopulmonary disease. Electronically Signed   By: Deatra Robinson M.D.   On: 11/17/2016 05:45   Ct Angio Chest Pe W Or Wo Contrast  Result Date: 11/17/2016 CLINICAL DATA:  Chest pain and shortness of breath. EXAM: CT ANGIOGRAPHY CHEST WITH CONTRAST TECHNIQUE: Multidetector CT imaging of the chest was performed using the standard protocol during bolus administration of intravenous contrast. Multiplanar CT image reconstructions and MIPs were obtained to evaluate the vascular anatomy. CONTRAST:  75 mL Isovue 370 COMPARISON:  Chest radiograph 11/17/2016 FINDINGS: Cardiovascular: The  examination is severely degraded by motion and is nondiagnostic beyond the level of the lobar arteries. Within that limitation, there is no central pulmonary embolus. The main pulmonary artery is normal for size. No pericardial effusion. No visible aortic abnormality. No evidence of right heart strain. Mediastinum/Nodes: No mediastinal, hilar or axillary lymphadenopathy. The visualized thyroid and thoracic esophageal course are unremarkable. Lungs/Pleura: No pulmonary nodules or masses. No pleural effusion or pneumothorax. No focal airspace consolidation. No focal pleural abnormality. Calcified granuloma in the right mid lung. Upper Abdomen: Contrast bolus timing is not optimized for evaluation of the abdominal organs. Within this limitation, the visualized organs of the upper abdomen are normal. Musculoskeletal: No chest wall abnormality. No acute or significant osseous findings. Review of the MIP images confirms the above findings. IMPRESSION: 1. Severely motion degraded study precludes assessment of the pulmonary arteries beyond the lobar level. Within that limitation, there is no central pulmonary embolus. 2. No other visualized acute thoracic abnormality. Electronically Signed   By: Deatra Robinson M.D.   On: 11/17/2016 06:02   US Abdomen Limited Ruq  Result Date: 11/17/2016 CLINICAL DATA:  51 year old female with right upper quadrant abdominal pain. EXAM: ULTRASOUND ABDOMEN LIMITED RIGHT UPPER QUADRANT COMPARISON:  Abdominal CT  dated 05/09/2013 FINDINGS: Gallbladder: No gallstones or wall thickening visualized. No sonographic Murphy sign noted by sonographer. Common bile duct: Diameter: 4 mm Liver: There is diffuse increased liver echogenicity most consistent with fatty infiltration. The main portal vein is patent with hepatopetal flow. IMPRESSION: 1. No gallstone. 2. Fatty liver. Electronically Signed   By: Elgie CollardArash  Radparvar M.D.   On: 11/17/2016 05:26      Procedures   ____________________________________________   INITIAL IMPRESSION / ASSESSMENT AND PLAN / ED COURSE  Pertinent labs & imaging results that were available during my care of the patient were reviewed by me and considered in my medical decision making (see chart for details).  51 year old female presenting with epigastric abdominal pain that she states is 10 out of 10. Patient also admits to dyspnea and orthopnea. CT scan of the chest revealed no acute abnormality ultrasound likewise negative. As such patient will undergo CT scan of the abdomen and pelvis. Patient's care transferred to Dr. Sharma CovertNorman      ____________________________________________  FINAL CLINICAL IMPRESSION(S) / ED DIAGNOSES  Final diagnoses:  RUQ pain  Dyspnea     MEDICATIONS GIVEN DURING THIS VISIT:  Medications  gi cocktail (Maalox,Lidocaine,Donnatal) (30 mLs Oral Given 11/17/16 0432)  ondansetron (ZOFRAN) injection 4 mg (4 mg Intravenous Given 11/17/16 0432)  morphine 2 MG/ML injection 2 mg (2 mg Intravenous Given 11/17/16 0432)  iopamidol (ISOVUE-370) 76 % injection 75 mL (75 mLs Intravenous Contrast Given 11/17/16 0534)     NEW OUTPATIENT MEDICATIONS STARTED DURING THIS VISIT:  New Prescriptions   No medications on file    Modified Medications   No medications on file    Discontinued Medications   No medications on file     Note:  This document was prepared using Dragon voice recognition software and may include unintentional dictation errors.    Darci CurrentBrown, [place] N, MD 11/19/16 2239

## 2016-11-29 ENCOUNTER — Ambulatory Visit: Payer: Medicaid Other | Admitting: Primary Care

## 2018-10-15 ENCOUNTER — Other Ambulatory Visit: Payer: Self-pay

## 2018-10-15 ENCOUNTER — Encounter: Payer: Self-pay | Admitting: Intensive Care

## 2018-10-15 ENCOUNTER — Emergency Department: Payer: HRSA Program

## 2018-10-15 ENCOUNTER — Emergency Department
Admission: EM | Admit: 2018-10-15 | Discharge: 2018-10-15 | Disposition: A | Discharge status: Home / Self Care | Payer: HRSA Program | Attending: Emergency Medicine | Admitting: Emergency Medicine

## 2018-10-15 DIAGNOSIS — Z20822 Contact with and (suspected) exposure to covid-19: Secondary | ICD-10-CM

## 2018-10-15 DIAGNOSIS — Z20828 Contact with and (suspected) exposure to other viral communicable diseases: Secondary | ICD-10-CM | POA: Diagnosis not present

## 2018-10-15 DIAGNOSIS — R0981 Nasal congestion: Secondary | ICD-10-CM

## 2018-10-15 DIAGNOSIS — R079 Chest pain, unspecified: Secondary | ICD-10-CM | POA: Insufficient documentation

## 2018-10-15 DIAGNOSIS — I10 Essential (primary) hypertension: Secondary | ICD-10-CM | POA: Diagnosis not present

## 2018-10-15 DIAGNOSIS — R0789 Other chest pain: Secondary | ICD-10-CM | POA: Diagnosis not present

## 2018-10-15 LAB — CBC
HCT: 40.4 % (ref 36.0–46.0)
Hemoglobin: 12.2 g/dL (ref 12.0–15.0)
MCH: 19.5 pg — ABNORMAL LOW (ref 26.0–34.0)
MCHC: 30.2 g/dL (ref 30.0–36.0)
MCV: 64.5 fL — ABNORMAL LOW (ref 80.0–100.0)
Platelets: 223 10*3/uL (ref 150–400)
RBC: 6.26 MIL/uL — ABNORMAL HIGH (ref 3.87–5.11)
RDW: 15.9 % — ABNORMAL HIGH (ref 11.5–15.5)
WBC: 8 10*3/uL (ref 4.0–10.5)
nRBC: 0 % (ref 0.0–0.2)

## 2018-10-15 LAB — BASIC METABOLIC PANEL
Anion gap: 10 (ref 5–15)
BUN: 10 mg/dL (ref 6–20)
CO2: 23 mmol/L (ref 22–32)
Calcium: 9 mg/dL (ref 8.9–10.3)
Chloride: 108 mmol/L (ref 98–111)
Creatinine, Ser: 0.68 mg/dL (ref 0.44–1.00)
GFR calc Af Amer: >60 mL/min (ref >60)
GFR calc non Af Amer: >60 mL/min (ref >60)
Glucose, Bld: 95 mg/dL (ref 70–99)
Potassium: 3.7 mmol/L (ref 3.5–5.1)
Sodium: 141 mmol/L (ref 135–145)

## 2018-10-15 LAB — SARS CORONAVIRUS 2 BY RT PCR (HOSPITAL ORDER, PERFORMED IN ~~LOC~~ HOSPITAL LAB): SARS Coronavirus 2: NEGATIVE

## 2018-10-15 LAB — TROPONIN I
Troponin I: <0.03 ng/mL (ref <0.03)
Troponin I: <0.03 ng/mL (ref <0.03)

## 2018-10-15 MED ORDER — FLUTICASONE PROPIONATE 50 MCG/ACT NA SUSP: 2.0000 | Freq: Every day | NASAL | 0 refills | Status: AC

## 2018-10-15 MED ORDER — FLUTICASONE PROPIONATE 50 MCG/ACT NA SUSP: 2.0000 | Freq: Every day | NASAL | 0 refills | Status: DC

## 2018-10-15 MED ORDER — LIDOCAINE VISCOUS HCL 2 % MT SOLN: 10.0000 mL | OROMUCOSAL | 0 refills | Status: DC | PRN

## 2018-10-15 MED ORDER — ASPIRIN 325 MG PO TABS
325.0000 mg | ORAL_TABLET | Freq: Every day | ORAL | Status: DC
  Administered 2018-10-15: 325 mg via ORAL
  Filled 2018-10-15: qty 1

## 2018-10-15 MED ORDER — LIDOCAINE VISCOUS HCL 2 % MT SOLN: 10.0000 mL | OROMUCOSAL | 0 refills | Status: AC | PRN

## 2018-10-15 NOTE — ED Triage Notes (Addendum)
Patient c/o central chest pain, light headed and difficulty breathing intermittently X1 week. Patient reports coming in contact with family member who has covid. Patient speaks "rade" which was not available on interpretor. [place] was used with International aid/development worker.

## 2018-10-15 NOTE — ED Provider Notes (Signed)
Medical screening examination/treatment/procedure(s) were conducted as a shared visit with non-physician practitioner(s) and myself.  I personally evaluated the patient during the encounter.  Because of dialect, we were not able to obtain tele-interpretive services that worked well.  The patient's daughter however provides interpretation and both the daughter and the patient are comfortable with the daughter assisting.  Attempt was made to utilize tele-interpreter via the enemies but they have a hard to access dialect.  Patient has been having a slight discomfort in the chest intermittently for about a week.  This started around the time she came in contact with someone known to have coronavirus.  She has a friend with coronavirus, and daughter and the patient both wish to make certain she does not have that.  She has felt just a little light on breath, but in no distress.  She reports a very minimal discomfort somewhat hard to describe in the chest.  No known history of coronary disease.  Pain is not exertional.  No personal history of coronary disease.  Does have a history of hypertension.  Denies fevers or chills.  On my examination the patient is alert, in no distress with normal respiratory pattern and vital signs except for some hypertension.  She is very pleasant her daughter very pleasant the bedside as well.  Given her history we will certainly obtain COVID test she had close contact, and also her cardiac work-up here is reassuring the first negative troponin but given her age and history of hypertension I would feel more confident with a second troponin and the patient and family are agreeable with this plan.  Kimberly Blake was evaluated in Emergency Department on 10/15/2018 for the symptoms described in the history of present illness. She was evaluated in the context of the global COVID-19 pandemic, which necessitated consideration that the patient might be at risk for infection with the SARS-CoV-2  virus that causes COVID-19. Institutional protocols and algorithms that pertain to the evaluation of patients at risk for COVID-19 are in a state of rapid change based on information released by regulatory bodies including the CDC and federal and state organizations. These policies and algorithms were followed during the patient's care in the ED.  ED ECG REPORT I, Delman Kitten, the attending physician, personally viewed and interpreted this ECG.  Date: 10/15/2018 EKG Time: Noon Rate: 70 Rhythm: normal sinus rhythm QRS Axis: normal Intervals: normal ST/T Wave abnormalities: normal Narrative Interpretation: no evidence of acute ischemia    Delman Kitten, MD 10/16/18 2125

## 2018-10-15 NOTE — ED Provider Notes (Signed)
[facility] Emergency Department Provider Note  ____________________________________________  Time seen: Approximately 12:55 PM  I have reviewed the triage vital signs and the nursing notes.   HISTORY  Chief Complaint Chest Pain    HPI Kimberly Blake is a 53 y.o. female that presents to the emergency department for evaluation of nasal congestion, dry throat, and centralized chest pain for 3 days.  Patient states that chest pain has been constant but changes in severity throughout the day.  Patient was exposed to a friend with COVID 19 8 days ago.  No known fevers.  No known cardiac history.  Patient does not smoke.  She does not have a history of acid reflux.  She has taken Zyrtec for allergie, with some improvement of symptoms.  No shortness of breath, nausea, vomiting, abdominal pain.   Past Medical History:  Diagnosis Date  . GERD (gastroesophageal reflux disease)   . Hypertension   . Medical history non-contributory     Patient Active Problem List   Diagnosis Date Noted  . Appendicitis 05/09/2013    Past Surgical History:  Procedure Laterality Date  . APPENDECTOMY    . LAPAROSCOPIC APPENDECTOMY N/A 05/09/2013   Procedure: APPENDECTOMY LAPAROSCOPIC;  Surgeon: Romie LeveeAlicia Thomas, MD;  Location: [facility];  Service: General;  Laterality: N/A;  . NO PAST SURGERIES      Prior to Admission medications   Medication Sig Start Date End Date Taking? Authorizing Provider  amLODipine (NORVASC) 5 MG tablet Take 5 mg by mouth daily.    [provider]  butalbital-acetaminophen-caffeine (FIORICET, ESGIC) 50-325-40 MG tablet Take 1 tablet by mouth every 6 (six) hours as needed for headache.    [provider]  fluticasone (FLONASE) 50 MCG/ACT nasal spray Place 2 sprays into both nostrils daily. 10/15/18 10/15/19  En[place] DerryWagner, Lizette Pazos, PA-C  lidocaine (XYLOCAINE) 2 % solution Use as directed 10 mLs in the mouth or throat as needed. 10/15/18   [place] DerryWagner, Jacorie Ernsberger, PA-C   omeprazole (PRILOSEC) 20 MG capsule Take 1 capsule (20 mg total) by mouth daily. 11/09/16   Derwood KaplanNanavati, Ankit, MD  ondansetron (ZOFRAN ODT) 4 MG disintegrating tablet Take 1 tablet (4 mg total) by mouth every 8 (eight) hours as needed for nausea or vomiting. 11/17/16   Rockne MenghiniNorman, Anne-Caroline, MD  oxyCODONE-acetaminophen (ROXICET) 5-325 MG tablet Take 1 tablet by mouth every 4 (four) hours as needed for severe pain. 11/17/16   Rockne MenghiniNorman, Anne-Caroline, MD    Allergies Patient has no known allergies.  History reviewed. No pertinent family history.  Social History Social History   Tobacco Use  . Smoking status: Never Smoker  . Smokeless tobacco: Never Used  Substance Use Topics  . Alcohol use: No  . Drug use: No     Review of Systems  Constitutional: No fever/chills Eyes: No visual changes. No discharge. ENT: Positive for congestion and rhinorrhea. Cardiovascular: Positive for chest pain. Respiratory: Negative for cough. No SOB. Gastrointestinal: No abdominal pain.  No nausea, no vomiting.  No diarrhea.  No constipation. Musculoskeletal: Negative for musculoskeletal pain. Skin: Negative for rash, abrasions, lacerations, ecchymosis. Neurological: Negative for headaches.   ____________________________________________   PHYSICAL EXAM:  VITAL SIGNS: ED Triage Vitals  Enc Vitals Group     BP 10/15/18 1151 (!) 176/94     Pulse Rate 10/15/18 1151 68     Resp 10/15/18 1151 16     Temp 10/15/18 1151 98.4 F (36.9 C)     Temp Source 10/15/18 1151 Oral     SpO2  10/15/18 1151 99 %     Weight 10/15/18 1152 135 lb (61.2 kg)     Height --      Head Circumference --      Peak Flow --      Pain Score 10/15/18 1152 5     Pain Loc --      Pain Edu? --      Excl. in GC? --      Constitutional: Alert and oriented. Well appearing and in no acute distress. Eyes: Conjunctivae are normal. PERRL. EOMI. No discharge. Head: Atraumatic. ENT: No frontal and maxillary sinus tenderness.       Ears: Tympanic membranes pearly gray with good landmarks. No discharge.      Nose: Mild congestion/rhinnorhea.      Mouth/Throat: Mucous membranes are moist. Oropharynx non-erythematous. Tonsils not enlarged. No exudates. Uvula midline. Neck: No stridor.   Hematological/Lymphatic/Immunilogical: No cervical lymphadenopathy. Cardiovascular: Normal rate, regular rhythm.  Good peripheral circulation. Respiratory: Normal respiratory effort without tachypnea or retractions. Lungs CTAB. Good air entry to the bases with no decreased or absent breath sounds. Gastrointestinal: Bowel sounds 4 quadrants. Soft and nontender to palpation. No guarding or rigidity. No palpable masses. No distention. Musculoskeletal: Full range of motion to all extremities. No gross deformities appreciated. Neurologic:  Normal speech and language. No gross focal neurologic deficits are appreciated.  Skin:  Skin is warm, dry and intact. No rash noted. Psychiatric: Mood and affect are normal. Speech and behavior are normal. Patient exhibits appropriate insight and judgement.   ____________________________________________   LABS (all labs ordered are listed, but only abnormal results are displayed)  Labs Reviewed  CBC - Abnormal; Notable for the following components:      Result Value   RBC 6.26 (*)    MCV 64.5 (*)    MCH 19.5 (*)    RDW 15.9 (*)    All other components within normal limits  SARS CORONAVIRUS 2 (HOSPITAL ORDER, PERFORMED IN [facility])  BASIC METABOLIC PANEL  TROPONIN I  TROPONIN I   ____________________________________________  EKG   ____________________________________________  RADIOLOGY Lexine BatonI, Jagjit Riner, personally viewed and evaluated these images (plain radiographs) as part of my medical decision making, as well as reviewing the written report by the radiologist.  Dg Chest Port 1 View  Result Date: 10/15/2018 CLINICAL DATA:  Central chest pain. EXAM: PORTABLE CHEST 1 VIEW  COMPARISON:  November 17, 2016 FINDINGS: Nipple shadows project over the lung bases. Probable mild atelectasis in the bases. Mild cardiomegaly. The hila and mediastinum are unremarkable. The lungs are otherwise clear. IMPRESSION: No active disease. Electronically Signed   By: Gerome Samavid  Williams III M.D   On: 10/15/2018 12:34    ____________________________________________    PROCEDURES  Procedure(s) performed:    Procedures    Medications - No data to display   ____________________________________________   INITIAL IMPRESSION / ASSESSMENT AND PLAN / ED COURSE  Pertinent labs & imaging results that were available during my care of the patient were reviewed by me and considered in my medical decision making (see chart for details).  Review of the [place] CSRS was performed in accordance of the [facility] prior to dispensing any controlled drugs.   Patient presented to the emergency department for evaluation of nasal congestion, dry throat, mid chest pain. Vital signs and exam are reassuring.  Patient speaks Rade, which we do not have a tele interpreter for.  Daughter will interpret and patient is comfortable with this.  Chest x-ray negative  for acute cardiopulmonary processes.  EKG shows normal sinus rhythm.  Troponin is negative.  Lab work is reassuring.  Dr. Jacqualine Code has evaluated patient and is agreement with plan of care.  See MD note.  Patient feels comfortable going home. Patient will be discharged home with prescriptions for flonase and viscous lidocaine. Patient is to follow up with PCP as needed or otherwise directed. Patient is given ED precautions to return to the ED for any worsening or new symptoms.     ____________________________________________  FINAL CLINICAL IMPRESSION(S) / ED DIAGNOSES  Final diagnoses:  Atypical chest pain  Nasal congestion  Exposure to 2019 Novel Coronavirus      NEW MEDICATIONS STARTED DURING THIS VISIT:  ED Discharge Orders         Ordered     fluticasone (FLONASE) 50 MCG/ACT nasal spray  Daily,   Status:  Discontinued     10/15/18 1612    lidocaine (XYLOCAINE) 2 % solution  As needed,   Status:  Discontinued     10/15/18 1612    fluticasone (FLONASE) 50 MCG/ACT nasal spray  Daily     10/15/18 1621    lidocaine (XYLOCAINE) 2 % solution  As needed     10/15/18 1621              This chart was dictated using voice recognition software/Dragon. Despite best efforts to proofread, errors can occur which can change the meaning. Any change was purely unintentional.    Laban Emperor, PA-C 10/15/18 2010    Delman Kitten, MD 10/16/18 2130

## 2018-10-15 NOTE — Discharge Instructions (Signed)
Your lab work in the emergency department is reassuring.  Your COVID test was negative.  Please continue to self isolate your self for 7 more days after your COVID exposure.  Please follow-up with the health department, the [facility] clinic or the [facility] next week.

## 2019-07-28 ENCOUNTER — Ambulatory Visit: Payer: Self-pay | Attending: Internal Medicine

## 2019-07-28 DIAGNOSIS — Z23 Encounter for immunization: Secondary | ICD-10-CM

## 2019-07-28 NOTE — Progress Notes (Signed)
   Covid-19 Vaccination Clinic  Name:  Ashanty Coltrane    MRN: [id number] DOB: 12/11/1965  07/28/2019  Ms. Trettin was observed post Covid-19 immunization for 15 minutes without incident. She was provided with Vaccine Information Sheet and instruction to access the V-Safe system.   Ms. Heinemann was instructed to call 911 with any severe reactions post vaccine: Marland Kitchen Difficulty breathing  . Swelling of face and throat  . A fast heartbeat  . A bad rash all over body  . Dizziness and weakness

## 2020-08-31 ENCOUNTER — Emergency Department
Admission: EM | Admit: 2020-08-31 | Discharge: 2020-08-31 | Disposition: A | Discharge status: Home / Self Care | Payer: Self-pay | Attending: Emergency Medicine | Admitting: Emergency Medicine

## 2020-08-31 ENCOUNTER — Emergency Department: Payer: Self-pay

## 2020-08-31 ENCOUNTER — Other Ambulatory Visit: Payer: Self-pay

## 2020-08-31 DIAGNOSIS — I10 Essential (primary) hypertension: Secondary | ICD-10-CM | POA: Insufficient documentation

## 2020-08-31 DIAGNOSIS — Z20822 Contact with and (suspected) exposure to covid-19: Secondary | ICD-10-CM | POA: Insufficient documentation

## 2020-08-31 DIAGNOSIS — B349 Viral infection, unspecified: Secondary | ICD-10-CM | POA: Insufficient documentation

## 2020-08-31 DIAGNOSIS — E860 Dehydration: Secondary | ICD-10-CM | POA: Insufficient documentation

## 2020-08-31 DIAGNOSIS — Z79899 Other long term (current) drug therapy: Secondary | ICD-10-CM | POA: Insufficient documentation

## 2020-08-31 LAB — CBC WITH DIFFERENTIAL/PLATELET
Abs Immature Granulocytes: 0.02 10*3/uL (ref 0.00–0.07)
Basophils Absolute: 0 10*3/uL (ref 0.0–0.1)
Basophils Relative: 1 %
Eosinophils Absolute: 0.3 10*3/uL (ref 0.0–0.5)
Eosinophils Relative: 3 %
HCT: 40.8 % (ref 36.0–46.0)
Hemoglobin: 12.6 g/dL (ref 12.0–15.0)
Immature Granulocytes: 0 %
Lymphocytes Relative: 39 %
Lymphs Abs: 3 10*3/uL (ref 0.7–4.0)
MCH: 19.5 pg — ABNORMAL LOW (ref 26.0–34.0)
MCHC: 30.9 g/dL (ref 30.0–36.0)
MCV: 63.1 fL — ABNORMAL LOW (ref 80.0–100.0)
Monocytes Absolute: 0.4 10*3/uL (ref 0.1–1.0)
Monocytes Relative: 5 %
Neutro Abs: 4.1 10*3/uL (ref 1.7–7.7)
Neutrophils Relative %: 52 %
Platelets: 214 10*3/uL (ref 150–400)
RBC: 6.47 MIL/uL — ABNORMAL HIGH (ref 3.87–5.11)
RDW: 16.2 % — ABNORMAL HIGH (ref 11.5–15.5)
Smear Review: NORMAL
WBC: 7.7 10*3/uL (ref 4.0–10.5)
nRBC: 0 % (ref 0.0–0.2)

## 2020-08-31 LAB — COMPREHENSIVE METABOLIC PANEL
ALT: 28 U/L (ref 0–44)
AST: 30 U/L (ref 15–41)
Albumin: 4.3 g/dL (ref 3.5–5.0)
Alkaline Phosphatase: 58 U/L (ref 38–126)
Anion gap: 9 (ref 5–15)
BUN: 11 mg/dL (ref 6–20)
CO2: 27 mmol/L (ref 22–32)
Calcium: 9.2 mg/dL (ref 8.9–10.3)
Chloride: 105 mmol/L (ref 98–111)
Creatinine, Ser: 0.69 mg/dL (ref 0.44–1.00)
GFR, Estimated: >60 mL/min (ref >60)
Glucose, Bld: 97 mg/dL (ref 70–99)
Potassium: 3.3 mmol/L — ABNORMAL LOW (ref 3.5–5.1)
Sodium: 141 mmol/L (ref 135–145)
Total Bilirubin: 0.9 mg/dL (ref 0.3–1.2)
Total Protein: 7.8 g/dL (ref 6.5–8.1)

## 2020-08-31 LAB — URINALYSIS, COMPLETE (UACMP) WITH MICROSCOPIC
Bacteria, UA: NONE SEEN
Bilirubin Urine: NEGATIVE
Glucose, UA: NEGATIVE mg/dL
Hgb urine dipstick: NEGATIVE
Ketones, ur: 5 mg/dL — AB
Leukocytes,Ua: NEGATIVE
Nitrite: NEGATIVE
Protein, ur: NEGATIVE mg/dL
Specific Gravity, Urine: 1.003 — ABNORMAL LOW (ref 1.005–1.030)
WBC, UA: NONE SEEN WBC/hpf (ref 0–5)
pH: 8 (ref 5.0–8.0)

## 2020-08-31 LAB — CK: Total CK: 157 U/L (ref 38–234)

## 2020-08-31 LAB — RESP PANEL BY RT-PCR (FLU A&B, COVID) ARPGX2
Influenza A by PCR: NEGATIVE
Influenza B by PCR: NEGATIVE
SARS Coronavirus 2 by RT PCR: NEGATIVE

## 2020-08-31 MED ORDER — SODIUM CHLORIDE 0.9 % IV BOLUS
1000.0000 mL | Freq: Once | INTRAVENOUS | Status: AC
  Administered 2020-08-31: 1000 mL via INTRAVENOUS

## 2020-08-31 NOTE — ED Notes (Signed)
Pt has been provided with discharge instructions. Pt denies any questions or concerns at this time. Pt verbalizes understanding for follow up care and d/c.  VSS.  Pt left department with all belongings.  

## 2020-08-31 NOTE — ED Provider Notes (Signed)
[facility] Emergency Department Provider Note  ____________________________________________   Event Date/Time   First MD Initiated Contact with Patient 08/31/20 1353     (approximate)  I have reviewed the triage vital signs and the nursing notes.   HISTORY  Chief Complaint Cough, Fever, Dizziness, and Diarrhea  Patient is seen with the assistance of her daughter as a Nurse, learning disability given that our interpreter services do not cover her spoken language  HPI Kimberly Blake is a 55 y.o. female who reports to the emergency department for evaluation of cough, subjective fever that began last Sunday.  All of the family members in the home have similar symptoms and tested negative for COVID.  She then began having diarrhea yesterday with associated dizziness afterwards.  She denies taking her temperature at home, unknown T-max.  She reports associated muscle pain "all over".  She also reports headache intermittently.  She has been using OTC medications at home without improvement.  She denies any dysuria, abdominal pain, nausea or vomiting.  She denies any chest pain or shortness of breath.       Past Medical History:  Diagnosis Date  . GERD (gastroesophageal reflux disease)   . Hypertension   . Medical history non-contributory     Patient Active Problem List   Diagnosis Date Noted  . Appendicitis 05/09/2013    Past Surgical History:  Procedure Laterality Date  . APPENDECTOMY    . LAPAROSCOPIC APPENDECTOMY N/A 05/09/2013   Procedure: APPENDECTOMY LAPAROSCOPIC;  Surgeon: Romie Levee, MD;  Location: [facility];  Service: General;  Laterality: N/A;  . NO PAST SURGERIES      Prior to Admission medications   Medication Sig Start Date End Date Taking? Authorizing Provider  amLODipine (NORVASC) 5 MG tablet Take 5 mg by mouth daily.    [provider]  butalbital-acetaminophen-caffeine (FIORICET, ESGIC) 50-325-40 MG tablet Take 1 tablet by mouth every 6 (six)  hours as needed for headache.    [provider]  fluticasone (FLONASE) 50 MCG/ACT nasal spray Place 2 sprays into both nostrils daily. 10/15/18 10/15/19  Enid Derry, PA-C  lidocaine (XYLOCAINE) 2 % solution Use as directed 10 mLs in the mouth or throat as needed. 10/15/18   Enid Derry, PA-C  omeprazole (PRILOSEC) 20 MG capsule Take 1 capsule (20 mg total) by mouth daily. 11/09/16   Derwood Kaplan, MD  ondansetron (ZOFRAN ODT) 4 MG disintegrating tablet Take 1 tablet (4 mg total) by mouth every 8 (eight) hours as needed for nausea or vomiting. 11/17/16   Rockne Menghini, MD  oxyCODONE-acetaminophen (ROXICET) 5-325 MG tablet Take 1 tablet by mouth every 4 (four) hours as needed for severe pain. 11/17/16   Rockne Menghini, MD    Allergies Patient has no known allergies.  History reviewed. No pertinent family history.  Social History Social History   Tobacco Use  . Smoking status: Never Smoker  . Smokeless tobacco: Never Used  Substance Use Topics  . Alcohol use: No  . Drug use: No    Review of Systems Constitutional: + fever/chills Eyes: No visual changes. ENT: No sore throat. Cardiovascular: Denies chest pain. Respiratory: + Cough, denies shortness of breath. + diarrhea.  No constipation. Genitourinary: Negative for dysuria. Musculoskeletal: Negative for back pain. Skin: Negative for rash. Neurological: + Intermittent headaches, negative for focal weakness or numbness.  ____________________________________________   PHYSICAL EXAM:  VITAL SIGNS: ED Triage Vitals  Enc Vitals Group     BP 08/31/20 1356 (!) 176/81  Pulse Rate 08/31/20 1356 66     Resp 08/31/20 1356 20     Temp 08/31/20 1356 98.3 F (36.8 C)     Temp Source 08/31/20 1356 Oral     SpO2 08/31/20 1356 98 %     Weight 08/31/20 1354 140 lb (63.5 kg)     Height 08/31/20 1354 5' (1.524 m)     Head Circumference --      Peak Flow --      Pain Score 08/31/20 1354 4     Pain Loc --       Pain Edu? --      Excl. in [place]? --    Constitutional: Alert and oriented. Well appearing and in no acute distress. Eyes: Conjunctivae are normal. PERRL. EOMI. Head: Atraumatic. Nose: No congestion/rhinnorhea. Mouth/Throat: Mucous membranes are moist.  Oropharynx erythematous with no tonsillar enlargement or exudate. Neck: No stridor.  No tenderness to palpation of the midline or paraspinals of the cervical spine.  No meningeal signs. Cardiovascular: Normal rate, regular rhythm. Grossly normal heart sounds.  Good peripheral circulation. Respiratory: Normal respiratory effort.  No retractions. Lungs CTAB. Gastrointestinal: Soft and nontender. No distention. No abdominal bruits. No CVA tenderness. Musculoskeletal: No lower extremity tenderness nor edema.  No joint effusions. Neurologic:  Normal speech and language. No gross focal neurologic deficits are appreciated. No gait instability. Skin:  Skin is warm, dry and intact. No rash noted. Psychiatric: Mood and affect are normal. Speech and behavior are normal.  ____________________________________________   LABS (all labs ordered are listed, but only abnormal results are displayed)  Labs Reviewed  COMPREHENSIVE METABOLIC PANEL - Abnormal; Notable for the following components:      Result Value   Potassium 3.3 (*)    All other components within normal limits  CBC WITH DIFFERENTIAL/PLATELET - Abnormal; Notable for the following components:   RBC 6.47 (*)    MCV 63.1 (*)    MCH 19.5 (*)    RDW 16.2 (*)    All other components within normal limits  URINALYSIS, COMPLETE (UACMP) WITH MICROSCOPIC - Abnormal; Notable for the following components:   Color, Urine COLORLESS (*)    APPearance CLEAR (*)    Specific Gravity, Urine 1.003 (*)    Ketones, ur 5 (*)    All other components within normal limits  RESP PANEL BY RT-PCR (FLU A&B, COVID) ARPGX2  CK   ____________________________________________  EKG  Normal sinus rhythm with a  rate of 63 bpm.  Narrow QRS.  No ST elevation or depression to suggest acute ischemia. ____________________________________________  RADIOLOGY I, Lucy Chris, personally viewed and evaluated these images (plain radiographs) as part of my medical decision making, as well as reviewing the written report by the radiologist.  ED provider interpretation: Chest x-ray does not show any focal acute pneumonia, see radiology report for CT findings  Official radiology report(s): CT Head Wo Contrast  Result Date: 08/31/2020 CLINICAL DATA:  55 year old female with dizziness for 1 week. EXAM: CT HEAD WITHOUT CONTRAST TECHNIQUE: Contiguous axial images were obtained from the base of the skull through the vertex without intravenous contrast. COMPARISON:  None. FINDINGS: Brain: No evidence of acute infarction, hemorrhage, hydrocephalus, extra-axial collection or mass lesion/mass effect. Vascular: No hyperdense vessel or unexpected calcification. Skull: Normal. Negative for fracture or focal lesion. Sinuses/Orbits: No acute finding. Other: None. IMPRESSION: Unremarkable noncontrast head CT. Electronically Signed   By: Harmon Pier M.D.   On: 08/31/2020 16:49   DG Chest Portable 1 View  Result Date: 08/31/2020 CLINICAL DATA:  Cough and fever. EXAM: PORTABLE CHEST 1 VIEW COMPARISON:  October 15, 2018 FINDINGS: The heart size and mediastinal contours are within normal limits. Both lungs are clear. The visualized skeletal structures are unremarkable. IMPRESSION: No active disease. Electronically Signed   By: Gerome Sam III M.D   On: 08/31/2020 15:06    ____________________________________________   INITIAL IMPRESSION / ASSESSMENT AND PLAN / ED COURSE  As part of my medical decision making, I reviewed the following data within the electronic MEDICAL RECORD NUMBER Nursing notes reviewed and incorporated, Labs reviewed, Radiograph reviewed and Notes from prior ED visits        Patient is a 55 year old female who  presents to the emergency department for evaluation of acute illness has been present over the last week, see HPI for further details.  Overall, the patient's evaluation is reassuring.  She did receive 1 L of fluid and reports a moderate improvement in her symptoms.  She is asking to be discharged at this time.  Given her negative overall work-up, I feel that this is reasonable.  Advised her to return to the emergency department if she experiences any worsening of symptoms.      ____________________________________________   FINAL CLINICAL IMPRESSION(S) / ED DIAGNOSES  Final diagnoses:  Viral illness  Dehydration     ED Discharge Orders    None      *Please note:  Teagen Bucio was evaluated in Emergency Department on 08/31/2020 for the symptoms described in the history of present illness. She was evaluated in the context of the global COVID-19 pandemic, which necessitated consideration that the patient might be at risk for infection with the SARS-CoV-2 virus that causes COVID-19. Institutional protocols and algorithms that pertain to the evaluation of patients at risk for COVID-19 are in a state of rapid change based on information released by regulatory bodies including the CDC and federal and state organizations. These policies and algorithms were followed during the patient's care in the ED.  Some ED evaluations and interventions may be delayed as a result of limited staffing during and the pandemic.*   Note:  This document was prepared using Dragon voice recognition software and may include unintentional dictation errors.   Lucy Chris, PA 08/31/20 2123    Merwyn Katos, MD 08/31/20 2322

## 2020-08-31 NOTE — Discharge Instructions (Addendum)
Please continue to take in fluids and food orally as you are able. Return to the Emergency Department if you experience any worsening of symptoms.

## 2020-08-31 NOTE — ED Triage Notes (Signed)
Pt's daughter used for interpretation. Speaks [place] but not in translator. Pt comes pov with cough, fever, dizziness, diarrhea for a week. Ambulatory to room.

## 2021-04-01 ENCOUNTER — Emergency Department
Admission: EM | Admit: 2021-04-01 | Discharge: 2021-04-01 | Disposition: A | Discharge status: Home / Self Care | Payer: Self-pay | Attending: Emergency Medicine | Admitting: Emergency Medicine

## 2021-04-01 ENCOUNTER — Other Ambulatory Visit: Payer: Self-pay

## 2021-04-01 ENCOUNTER — Emergency Department: Payer: Self-pay

## 2021-04-01 DIAGNOSIS — Z20822 Contact with and (suspected) exposure to covid-19: Secondary | ICD-10-CM | POA: Insufficient documentation

## 2021-04-01 DIAGNOSIS — I10 Essential (primary) hypertension: Secondary | ICD-10-CM | POA: Insufficient documentation

## 2021-04-01 DIAGNOSIS — R079 Chest pain, unspecified: Secondary | ICD-10-CM | POA: Insufficient documentation

## 2021-04-01 DIAGNOSIS — Z79899 Other long term (current) drug therapy: Secondary | ICD-10-CM | POA: Insufficient documentation

## 2021-04-01 DIAGNOSIS — R1013 Epigastric pain: Secondary | ICD-10-CM | POA: Insufficient documentation

## 2021-04-01 LAB — BASIC METABOLIC PANEL
Anion gap: 6 (ref 5–15)
BUN: 13 mg/dL (ref 6–20)
CO2: 28 mmol/L (ref 22–32)
Calcium: 9.2 mg/dL (ref 8.9–10.3)
Chloride: 106 mmol/L (ref 98–111)
Creatinine, Ser: 0.62 mg/dL (ref 0.44–1.00)
GFR, Estimated: >60 mL/min (ref >60)
Glucose, Bld: 106 mg/dL — ABNORMAL HIGH (ref 70–99)
Potassium: 3.7 mmol/L (ref 3.5–5.1)
Sodium: 140 mmol/L (ref 135–145)

## 2021-04-01 LAB — TROPONIN I (HIGH SENSITIVITY)
Troponin I (High Sensitivity): 5 ng/L (ref <18)
Troponin I (High Sensitivity): <2 ng/L (ref <18)

## 2021-04-01 LAB — CBC
HCT: 43.2 % (ref 36.0–46.0)
Hemoglobin: 12.9 g/dL (ref 12.0–15.0)
MCH: 19.3 pg — ABNORMAL LOW (ref 26.0–34.0)
MCHC: 29.9 g/dL — ABNORMAL LOW (ref 30.0–36.0)
MCV: 64.8 fL — ABNORMAL LOW (ref 80.0–100.0)
Platelets: 218 10*3/uL (ref 150–400)
RBC: 6.67 MIL/uL — ABNORMAL HIGH (ref 3.87–5.11)
RDW: 17.7 % — ABNORMAL HIGH (ref 11.5–15.5)
WBC: 7.2 10*3/uL (ref 4.0–10.5)
nRBC: 0 % (ref 0.0–0.2)

## 2021-04-01 LAB — RESP PANEL BY RT-PCR (FLU A&B, COVID) ARPGX2
Influenza A by PCR: NEGATIVE
Influenza B by PCR: NEGATIVE
SARS Coronavirus 2 by RT PCR: NEGATIVE

## 2021-04-01 LAB — LIPASE, BLOOD: Lipase: 31 U/L (ref 11–51)

## 2021-04-01 MED ORDER — PANTOPRAZOLE SODIUM 40 MG PO TBEC: 40.0000 mg | DELAYED_RELEASE_TABLET | Freq: Every day | ORAL | 1 refills | Status: AC

## 2021-04-01 MED ORDER — IOHEXOL 300 MG/ML  SOLN
100.0000 mL | Freq: Once | INTRAMUSCULAR | Status: AC | PRN
  Administered 2021-04-01: 100 mL via INTRAVENOUS

## 2021-04-01 MED ORDER — POLYETHYLENE GLYCOL 3350 17 G PO PACK: 17.0000 g | PACK | Freq: Every day | ORAL | 0 refills | Status: AC

## 2021-04-01 MED ORDER — FAMOTIDINE 20 MG PO TABS
20.0000 mg | ORAL_TABLET | Freq: Once | ORAL | Status: AC
  Administered 2021-04-01: 20 mg via ORAL
  Filled 2021-04-01: qty 1

## 2021-04-01 MED ORDER — DOCUSATE SODIUM 100 MG PO CAPS
100.0000 mg | ORAL_CAPSULE | Freq: Once | ORAL | Status: AC
  Administered 2021-04-01: 100 mg via ORAL
  Filled 2021-04-01: qty 1

## 2021-04-01 NOTE — Discharge Instructions (Addendum)
Take the MiraLAX for constipation, and the Protonix to decrease stomach acid.  This should help with the pain.  Follow-up with the primary care doctor in the next 1 to 2 weeks.  Return to the ER for new, worsening, or persistent severe chest pain, abdominal pain, difficulty breathing, vomiting, fever, or any other new or worsening symptoms that concern you.

## 2021-04-01 NOTE — ED Provider Notes (Signed)
[facility] Emergency Department Provider Note ____________________________________________   Event Date/Time   First MD Initiated Contact with Patient 04/01/21 1138     (approximate)  I have reviewed the triage vital signs and the nursing notes.   HISTORY  Chief Complaint Abdominal Pain and Chest Pain  Interpretation provided by the family member at the patient's request  HPI Kimberly Blake is a 55 y.o. female with no active medical problems who presents with epigastric pain for the last 2 days, persistent course, worse with trying to eat, and associated with nausea but no vomiting.  She has some intermittent sharp pains radiating up to the chest but no shortness of breath or dizziness.  She has no diarrhea, and has been constipated recently.  She has no fever or chills.  She denies a prior history of this pain.  She had a meal at a church service the day before this started but has not had any foods that are out of the ordinary for her and has not traveled recently.  The patient was seen in urgent care and noted to have a possible infiltrate on chest x-ray and was referred to the ED for ACS rule out.  Past Medical History:  Diagnosis Date   GERD (gastroesophageal reflux disease)    Hypertension    Medical history non-contributory     Patient Active Problem List   Diagnosis Date Noted   Appendicitis 05/09/2013    Past Surgical History:  Procedure Laterality Date   APPENDECTOMY     LAPAROSCOPIC APPENDECTOMY N/A 05/09/2013   Procedure: APPENDECTOMY LAPAROSCOPIC;  Surgeon: Romie Levee, MD;  Location: [facility];  Service: General;  Laterality: N/A;   NO PAST SURGERIES      Prior to Admission medications   Medication Sig Start Date End Date Taking? Authorizing Provider  pantoprazole (PROTONIX) 40 MG tablet Take 1 tablet (40 mg total) by mouth daily. 04/01/21 05/31/21 Yes Dionne Bucy, MD  polyethylene glycol (MIRALAX) 17 g packet Take 17 g by mouth  daily for 14 days. 04/01/21 04/15/21 Yes Dionne Bucy, MD  amLODipine (NORVASC) 5 MG tablet Take 5 mg by mouth daily.    [provider]  butalbital-acetaminophen-caffeine (FIORICET, ESGIC) 50-325-40 MG tablet Take 1 tablet by mouth every 6 (six) hours as needed for headache.    [provider]  fluticasone (FLONASE) 50 MCG/ACT nasal spray Place 2 sprays into both nostrils daily. 10/15/18 10/15/19  Enid Derry, PA-C  lidocaine (XYLOCAINE) 2 % solution Use as directed 10 mLs in the mouth or throat as needed. 10/15/18   Enid Derry, PA-C  omeprazole (PRILOSEC) 20 MG capsule Take 1 capsule (20 mg total) by mouth daily. 11/09/16   Derwood Kaplan, MD  ondansetron (ZOFRAN ODT) 4 MG disintegrating tablet Take 1 tablet (4 mg total) by mouth every 8 (eight) hours as needed for nausea or vomiting. 11/17/16   Rockne Menghini, MD  oxyCODONE-acetaminophen (ROXICET) 5-325 MG tablet Take 1 tablet by mouth every 4 (four) hours as needed for severe pain. 11/17/16   Rockne Menghini, MD    Allergies Patient has no known allergies.  No family history on file.  Social History Social History   Tobacco Use   Smoking status: Never   Smokeless tobacco: Never  Substance Use Topics   Alcohol use: No   Drug use: No    Review of Systems  Constitutional: No fever/chills Eyes: No visual changes. ENT: No sore throat. Cardiovascular: Positive for intermittent chest pain. Respiratory: Denies shortness of  breath. Gastrointestinal: No vomiting or diarrhea.  Genitourinary: Negative for dysuria.  Musculoskeletal: Negative for back pain. Skin: Negative for rash. Neurological: Negative for headaches, focal weakness or numbness.   ____________________________________________   PHYSICAL EXAM:  VITAL SIGNS: ED Triage Vitals  Enc Vitals Group     BP 04/01/21 1053 138/68     Pulse Rate 04/01/21 1053 60     Resp 04/01/21 1053 20     Temp 04/01/21 1053 98.6 F (37 C)      Temp Source 04/01/21 1053 Oral     SpO2 04/01/21 1053 96 %     Weight 04/01/21 1051 140 lb (63.5 kg)     Height 04/01/21 1051 5' (1.524 m)     Head Circumference --      Peak Flow --      Pain Score 04/01/21 1051 10     Pain Loc --      Pain Edu? --      Excl. in [place]? --     Constitutional: Alert and oriented. Well appearing and in no acute distress. Eyes: Conjunctivae are normal.  Head: Atraumatic. Nose: No congestion/rhinnorhea. Mouth/Throat: Mucous membranes are moist.   Neck: Normal range of motion.  Cardiovascular: Normal rate, regular rhythm. Grossly normal heart sounds.  Good peripheral circulation. Respiratory: Normal respiratory effort.  No retractions. Lungs CTAB. Gastrointestinal: Soft and nontender. No distention.  Genitourinary: No flank tenderness. Musculoskeletal: No lower extremity edema.  Extremities warm and well perfused.  Neurologic:  Normal speech and language. No gross focal neurologic deficits are appreciated.  Skin:  Skin is warm and dry. No rash noted. Psychiatric: Mood and affect are normal. Speech and behavior are normal.  ____________________________________________   LABS (all labs ordered are listed, but only abnormal results are displayed)  Labs Reviewed  BASIC METABOLIC PANEL - Abnormal; Notable for the following components:      Result Value   Glucose, Bld 106 (*)    All other components within normal limits  CBC - Abnormal; Notable for the following components:   RBC 6.67 (*)    MCV 64.8 (*)    MCH 19.3 (*)    MCHC 29.9 (*)    RDW 17.7 (*)    All other components within normal limits  RESP PANEL BY RT-PCR (FLU A&B, COVID) ARPGX2  LIPASE, BLOOD  POC URINE PREG, ED  TROPONIN I (HIGH SENSITIVITY)  TROPONIN I (HIGH SENSITIVITY)   ____________________________________________  EKG  ED ECG REPORT I, Arta Silence, the attending physician, personally viewed and interpreted this ECG.  Date: 04/01/2021 EKG Time: 1050 Rate:  62 Rhythm: normal sinus rhythm QRS Axis: normal Intervals: normal ST/T Wave abnormalities: Nonspecific T wave abnormality Narrative Interpretation: Nonspecific T wave abnormality with no evidence of acute ischemia  ____________________________________________  RADIOLOGY  Chest x-ray interpreted by me shows no focal consolidation or edema  CT abdomen/pelvis: IMPRESSION:  1. No acute findings to explain the patient's clinical history. A  fair amount of stool in the colon is indicative of constipation.  2. Trace bilateral pleural effusions.  3. Hepatic steatosis.   ____________________________________________   PROCEDURES  Procedure(s) performed: No  Procedures  Critical Care performed: No ____________________________________________   INITIAL IMPRESSION / ASSESSMENT AND PLAN / ED COURSE  Pertinent labs & imaging results that were available during my care of the patient were reviewed by me and considered in my medical decision making (see chart for details).   55 year old female with no active medical problems presents with epigastric pain for  the last few days associated with some nausea, constipation, and intermittent sharp chest pain.  On exam the patient is well-appearing.  Her vital signs are normal except for hypertension.  The abdomen is soft with no focal tenderness.  Physical exam is otherwise unremarkable.  I reviewed the past medical records in care everywhere.  The patient was seen in urgent care earlier today and had a chest x-ray showing possible right-sided infiltrate.  She was also referred to the ED for ACS rule out.  Chest x-ray here shows no acute findings.  EKG is nonischemic.  Lab work-up is unremarkable including 2 troponins.  Differential includes gastritis, GERD, musculoskeletal pain, radiculopathy, or other benign etiology.  I have a low suspicion for colitis, diverticulitis, or other acute intra-abdominal cause.  There is no evidence of pancreatitis  given in the negative lipase.  We will obtain a CT abdomen for further evaluation.  ----------------------------------------- 4:03 PM on 04/01/2021 -----------------------------------------  CT abdomen is negative for acute findings although does show a fair amount of stool consistent with constipation.  At this time, the patient is stable for discharge home.  Overall presentation is consistent with gastritis and/or GERD.  I counseled the patient on the results of the work-up and plan of care.  I will give a dose of Pepcid and Colace here in the ED, and will prescribe MiraLAX and Protonix.  I answered all the patient's questions.  Return precautions given, and she and the daughter expressed understanding.  ____________________________________________   FINAL CLINICAL IMPRESSION(S) / ED DIAGNOSES  Final diagnoses:  Epigastric pain      NEW MEDICATIONS STARTED DURING THIS VISIT:  Discharge Medication List as of 04/01/2021  2:45 PM     START taking these medications   Details  pantoprazole (PROTONIX) 40 MG tablet Take 1 tablet (40 mg total) by mouth daily., Starting Wed 04/01/2021, Until Sun 05/31/2021, Normal    polyethylene glycol (MIRALAX) 17 g packet Take 17 g by mouth daily for 14 days., Starting Wed 04/01/2021, Until Wed 04/15/2021, Normal         Note:  This document was prepared using Dragon voice recognition software and may include unintentional dictation errors.    Arta Silence, MD 04/01/21 [id number]

## 2021-04-01 NOTE — ED Triage Notes (Signed)
Pt sent from [facility] with c/o generalized abd pain for the past 3 days, states today pain was radiating into the chest. Pt c/o nausea and has not had a BM in 3 days

## 2021-04-01 NOTE — ED Triage Notes (Signed)
Pt here with abd pain since Monday and CP that started today from the front of her chest radiating to her back. Pt has nausea and has been constipated for 3 days. Pt in NAD in triage.

## 2021-12-05 IMAGING — CR DG CHEST 2V
2 series · 2 of 2 positions shown · non-contrast
Comparison: 08/31/2020

CLINICAL DATA: Chest and abdominal pain

Nausea
Constipation for 3 days
EXAM:
CHEST - 2 VIEW

[chest pa]
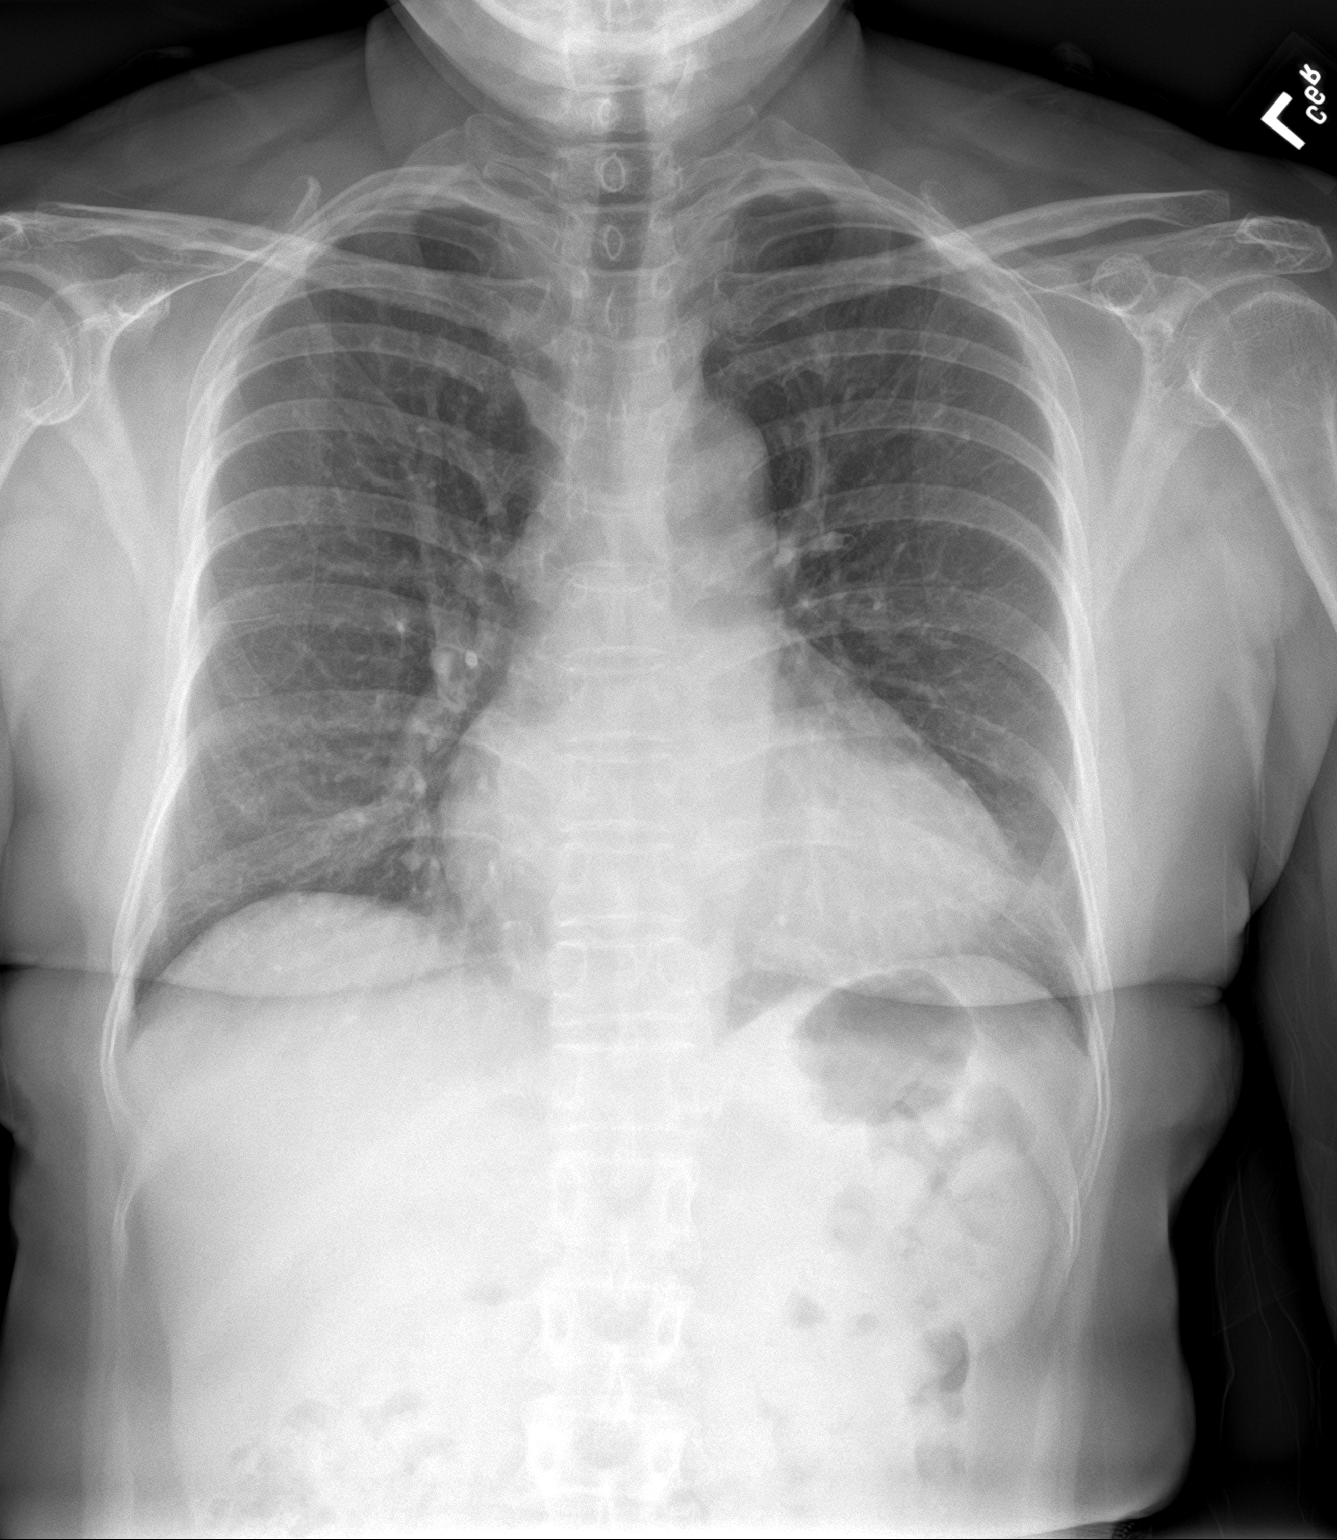

[chest lat]
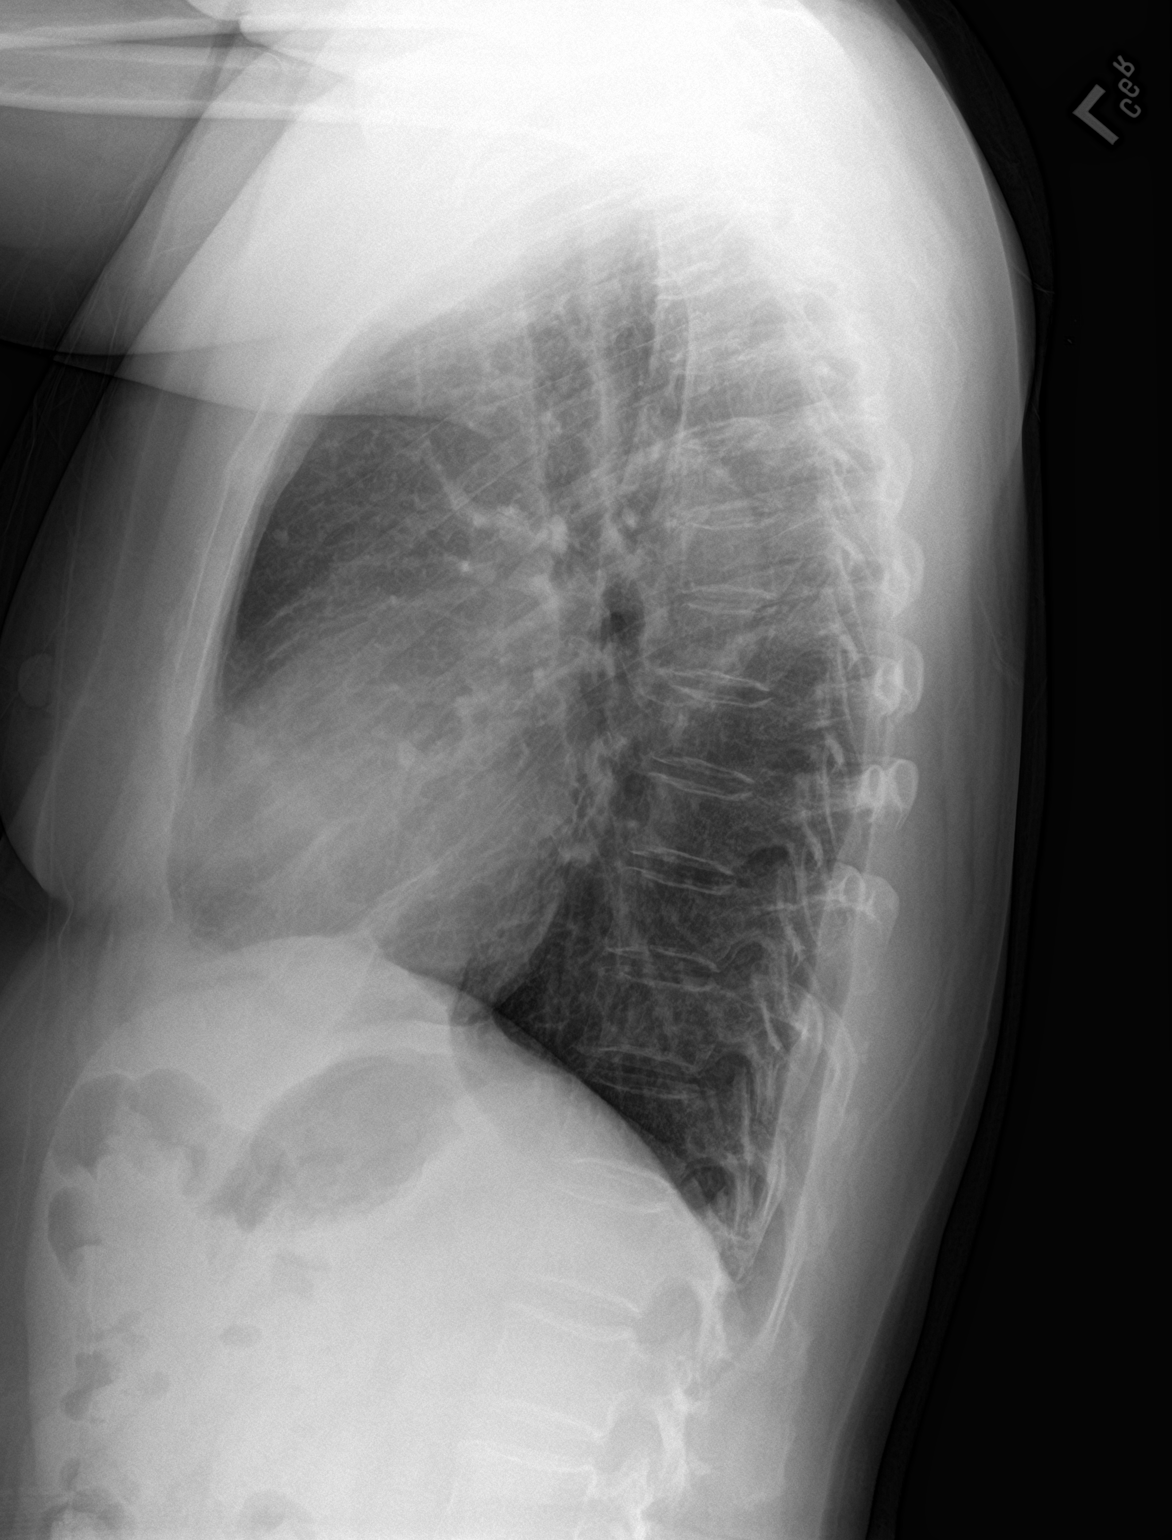

[2 of 2 positions shown; findings below may reference images not displayed]

FINDINGS: The heart size and mediastinal contours are within normal limits.
Both lungs are clear. The visualized skeletal structures are
unremarkable.
IMPRESSION: No active cardiopulmonary disease.
